# Patient Record
Sex: Female | Born: 1945 | Marital: Married | Smoking: Never smoker
Health system: Southern US, Community
[De-identification: ages and names within clinical notes are randomized; demographics above are authoritative.]

## PROBLEM LIST (undated history)

## (undated) DIAGNOSIS — E785 Hyperlipidemia, unspecified: Secondary | ICD-10-CM

---

## 2005-03-24 ENCOUNTER — Ambulatory Visit: Payer: Self-pay | Admitting: Internal Medicine

## 2006-10-20 ENCOUNTER — Ambulatory Visit: Payer: Self-pay | Admitting: Internal Medicine

## 2008-03-21 ENCOUNTER — Ambulatory Visit: Payer: Self-pay | Admitting: Family Medicine

## 2009-09-03 ENCOUNTER — Ambulatory Visit: Payer: Self-pay | Admitting: Family Medicine

## 2010-10-22 ENCOUNTER — Ambulatory Visit: Payer: Self-pay | Admitting: Family Medicine

## 2012-01-06 ENCOUNTER — Ambulatory Visit: Payer: Self-pay

## 2013-01-13 ENCOUNTER — Ambulatory Visit: Payer: Self-pay | Admitting: Nurse Practitioner

## 2013-07-18 LAB — LIPASE, BLOOD: Lipase: 132 U/L (ref 73–393)

## 2013-07-18 LAB — CBC
HCT: 38.9 % (ref 35.0–47.0)
HGB: 13.2 g/dL (ref 12.0–16.0)
MCH: 28.4 pg (ref 26.0–34.0)
MCHC: 33.9 g/dL (ref 32.0–36.0)
MCV: 84 fL (ref 80–100)
Platelet: 221 10*3/uL (ref 150–440)
RDW: 13.4 % (ref 11.5–14.5)

## 2013-07-18 LAB — COMPREHENSIVE METABOLIC PANEL
Alkaline Phosphatase: 123 U/L (ref 50–136)
Co2: 26 mmol/L (ref 21–32)
Creatinine: 0.82 mg/dL (ref 0.60–1.30)
EGFR (Non-African Amer.): >60
SGPT (ALT): 32 U/L (ref 12–78)
Sodium: 139 mmol/L (ref 136–145)
Total Protein: 6.9 g/dL (ref 6.4–8.2)

## 2013-07-19 ENCOUNTER — Observation Stay: Payer: Self-pay | Admitting: Surgery

## 2013-07-19 LAB — CBC WITH DIFFERENTIAL/PLATELET
HCT: 33.9 % — ABNORMAL LOW (ref 35.0–47.0)
HGB: 11.8 g/dL — ABNORMAL LOW (ref 12.0–16.0)
Lymphocyte #: 1.8 10*3/uL (ref 1.0–3.6)
Lymphocyte %: 29.6 %
MCH: 28.7 pg (ref 26.0–34.0)
MCHC: 34.8 g/dL (ref 32.0–36.0)
MCV: 82 fL (ref 80–100)
Monocyte %: 9.2 %
Neutrophil #: 3.7 10*3/uL (ref 1.4–6.5)
Neutrophil %: 60.7 %
Platelet: 217 10*3/uL (ref 150–440)
WBC: 6.1 10*3/uL (ref 3.6–11.0)

## 2013-07-19 LAB — COMPREHENSIVE METABOLIC PANEL
Albumin: 3.2 g/dL — ABNORMAL LOW (ref 3.4–5.0)
Alkaline Phosphatase: 88 U/L (ref 50–136)
Anion Gap: 5 — ABNORMAL LOW (ref 7–16)
Bilirubin,Total: 0.5 mg/dL (ref 0.2–1.0)
Calcium, Total: 9.5 mg/dL (ref 8.5–10.1)
Creatinine: 0.7 mg/dL (ref 0.60–1.30)
EGFR (African American): >60
EGFR (Non-African Amer.): >60
Glucose: 118 mg/dL — ABNORMAL HIGH (ref 65–99)
Osmolality: 278 (ref 275–301)
Potassium: 3.8 mmol/L (ref 3.5–5.1)
SGOT(AST): 57 U/L — ABNORMAL HIGH (ref 15–37)
SGPT (ALT): 33 U/L (ref 12–78)
Sodium: 138 mmol/L (ref 136–145)
Total Protein: 6.1 g/dL — ABNORMAL LOW (ref 6.4–8.2)

## 2013-07-19 LAB — PROTIME-INR: Prothrombin Time: 13 secs (ref 11.5–14.7)

## 2013-07-19 LAB — LIPASE, BLOOD: Lipase: 100 U/L (ref 73–393)

## 2013-07-19 LAB — AMYLASE: Amylase: 43 U/L (ref 25–115)

## 2013-07-19 LAB — APTT: Activated PTT: 30 secs (ref 23.6–35.9)

## 2013-07-23 ENCOUNTER — Emergency Department: Payer: Self-pay | Admitting: Emergency Medicine

## 2013-07-23 LAB — BASIC METABOLIC PANEL
Anion Gap: 6 — ABNORMAL LOW (ref 7–16)
BUN: 15 mg/dL (ref 7–18)
Calcium, Total: 10.1 mg/dL (ref 8.5–10.1)
Chloride: 108 mmol/L — ABNORMAL HIGH (ref 98–107)
EGFR (Non-African Amer.): >60
Glucose: 94 mg/dL (ref 65–99)
Osmolality: 282 (ref 275–301)
Potassium: 3.8 mmol/L (ref 3.5–5.1)

## 2013-07-23 LAB — CBC
HGB: 10.7 g/dL — ABNORMAL LOW (ref 12.0–16.0)
MCH: 28.7 pg (ref 26.0–34.0)
Platelet: 238 10*3/uL (ref 150–440)
RBC: 3.72 10*6/uL — ABNORMAL LOW (ref 3.80–5.20)
RDW: 13.1 % (ref 11.5–14.5)
WBC: 5.6 10*3/uL (ref 3.6–11.0)

## 2013-12-14 ENCOUNTER — Ambulatory Visit: Payer: Self-pay | Admitting: Nurse Practitioner

## 2013-12-14 LAB — CREATININE, SERUM
Creatinine: 0.66 mg/dL (ref 0.60–1.30)
EGFR (African American): >60
EGFR (Non-African Amer.): >60

## 2014-03-14 ENCOUNTER — Ambulatory Visit: Payer: Self-pay | Admitting: Obstetrics and Gynecology

## 2015-01-11 DIAGNOSIS — F419 Anxiety disorder, unspecified: Secondary | ICD-10-CM | POA: Diagnosis not present

## 2015-01-11 DIAGNOSIS — E782 Mixed hyperlipidemia: Secondary | ICD-10-CM | POA: Diagnosis not present

## 2015-01-11 DIAGNOSIS — Z79899 Other long term (current) drug therapy: Secondary | ICD-10-CM | POA: Diagnosis not present
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
## 2015-03-26 DIAGNOSIS — H3553 Other dystrophies primarily involving the sensory retina: Secondary | ICD-10-CM | POA: Diagnosis not present

## 2015-07-26 ENCOUNTER — Other Ambulatory Visit: Payer: Self-pay | Admitting: Nurse Practitioner

## 2015-07-26 DIAGNOSIS — Z1231 Encounter for screening mammogram for malignant neoplasm of breast: Secondary | ICD-10-CM

## 2015-07-26 DIAGNOSIS — R220 Localized swelling, mass and lump, head: Secondary | ICD-10-CM | POA: Diagnosis not present

## 2015-07-26 DIAGNOSIS — Z79899 Other long term (current) drug therapy: Secondary | ICD-10-CM | POA: Diagnosis not present

## 2015-07-26 DIAGNOSIS — E782 Mixed hyperlipidemia: Secondary | ICD-10-CM | POA: Diagnosis not present

## 2015-07-26 DIAGNOSIS — Z0000 Encounter for general adult medical examination without abnormal findings: Secondary | ICD-10-CM | POA: Diagnosis not present

## 2015-08-07 ENCOUNTER — Ambulatory Visit
Admission: RE | Admit: 2015-08-07 | Discharge: 2015-08-07 | Disposition: A | Discharge status: Home / Self Care | Payer: Commercial Managed Care - HMO | Source: Ambulatory Visit | Attending: Nurse Practitioner | Admitting: Nurse Practitioner

## 2015-08-07 DIAGNOSIS — Z1231 Encounter for screening mammogram for malignant neoplasm of breast: Secondary | ICD-10-CM | POA: Diagnosis not present

## 2015-12-03 DIAGNOSIS — B029 Zoster without complications: Secondary | ICD-10-CM | POA: Diagnosis not present

## 2016-01-24 DIAGNOSIS — E782 Mixed hyperlipidemia: Secondary | ICD-10-CM | POA: Diagnosis not present

## 2016-01-24 DIAGNOSIS — Z79899 Other long term (current) drug therapy: Secondary | ICD-10-CM | POA: Diagnosis not present

## 2016-07-08 IMAGING — MG MM DIGITAL SCREENING BILAT W/ CAD
1 series · 4 of 4 positions shown · non-contrast
Comparison: Previous exam(s).

CLINICAL DATA: Screening.

EXAM:
DIGITAL SCREENING BILATERAL MAMMOGRAM WITH CAD

[R CC · right · 4 of 4 slices shown]
[im 1/4]
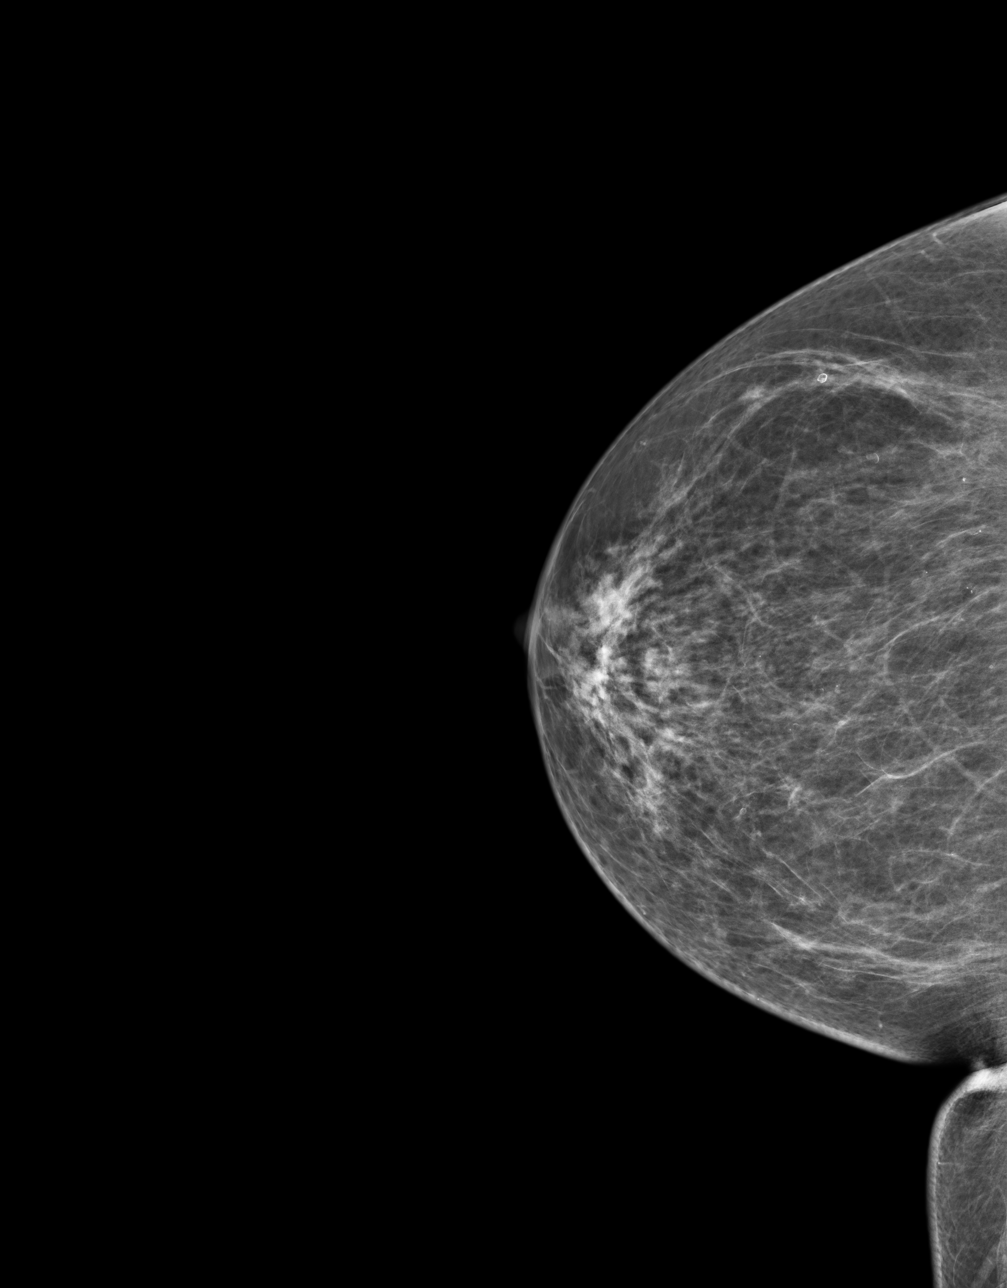
[im 2/4]
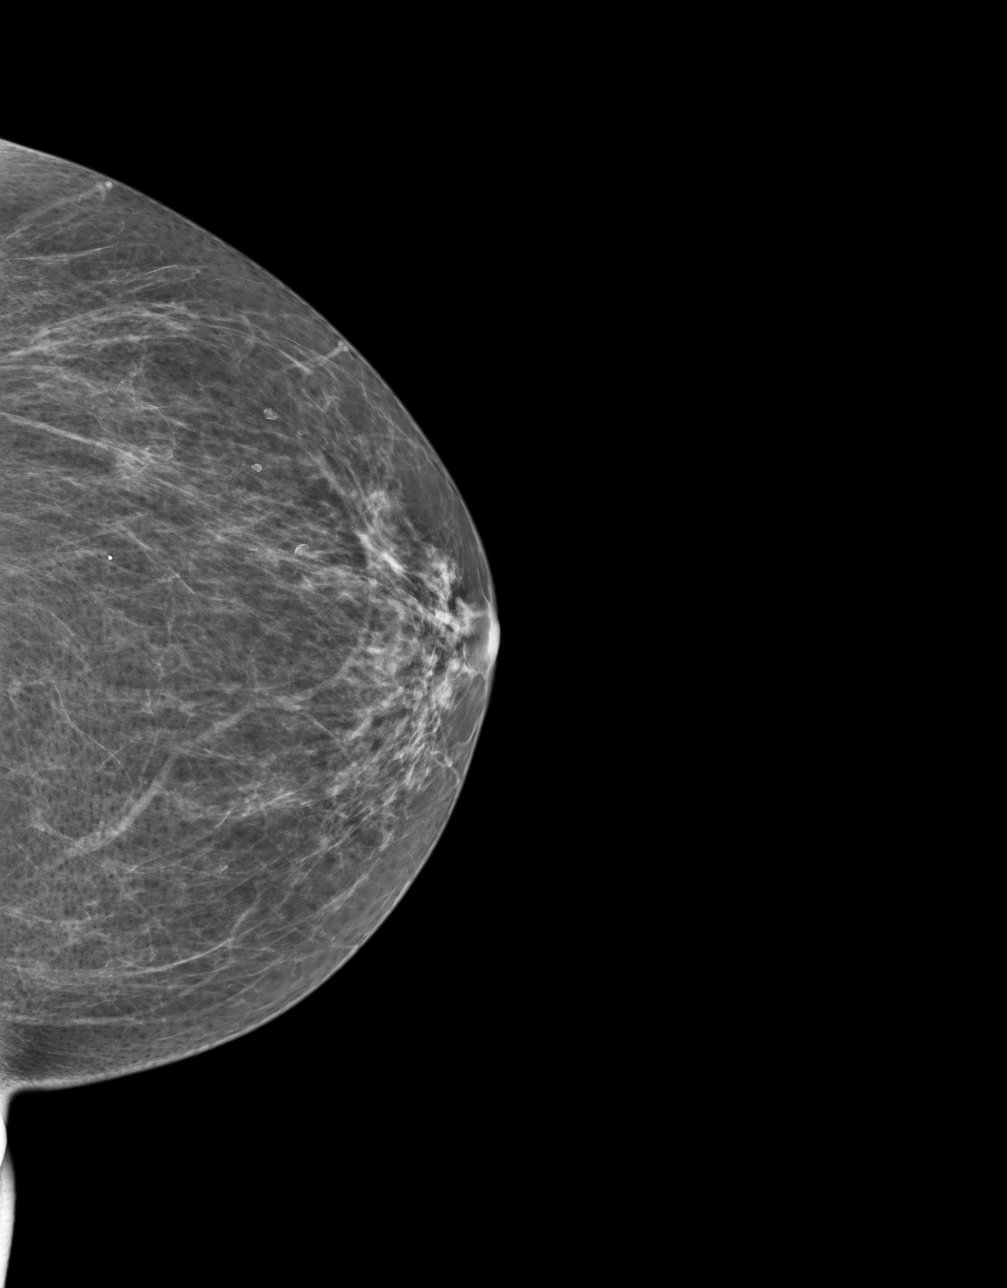
[im 3/4]
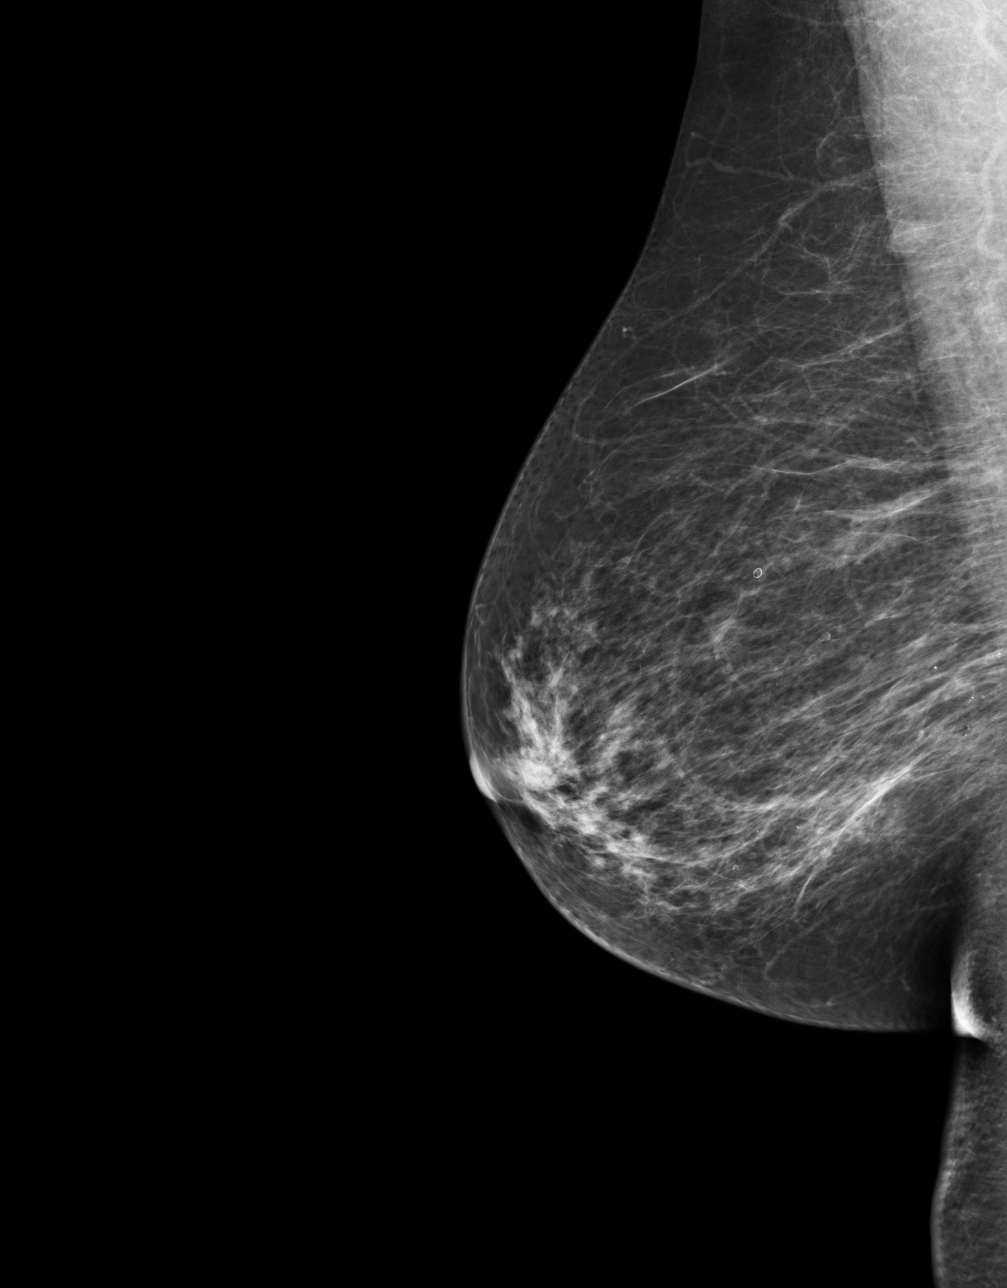
[im 4/4]
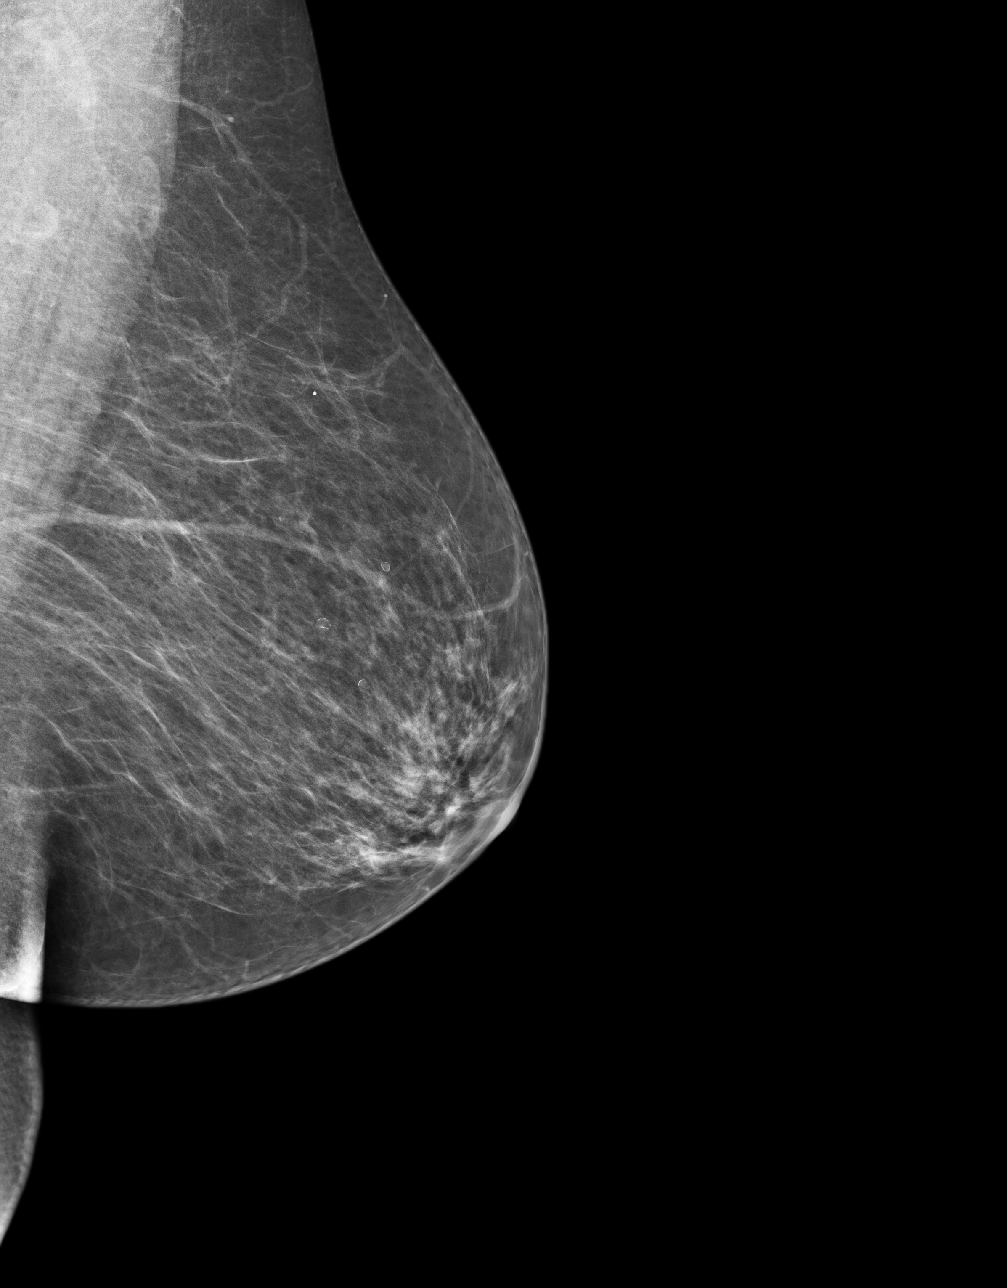

[4 of 4 positions shown; findings below may reference images not displayed]

ACR Breast Density Category b: There are scattered areas of
fibroglandular density.
FINDINGS: There are no findings suspicious for malignancy. Images were
processed with CAD.
IMPRESSION: No mammographic evidence of malignancy. A result letter of this
screening mammogram will be mailed directly to the patient.

RECOMMENDATION:
Screening mammogram in one year. (Code:AS-G-LCT)

BI-RADS CATEGORY  1: Negative.

## 2016-07-24 ENCOUNTER — Other Ambulatory Visit: Payer: Self-pay | Admitting: Nurse Practitioner

## 2016-07-24 DIAGNOSIS — Z1231 Encounter for screening mammogram for malignant neoplasm of breast: Secondary | ICD-10-CM | POA: Diagnosis not present

## 2016-07-24 DIAGNOSIS — Z79899 Other long term (current) drug therapy: Secondary | ICD-10-CM | POA: Diagnosis not present

## 2016-07-24 DIAGNOSIS — Z1159 Encounter for screening for other viral diseases: Secondary | ICD-10-CM | POA: Diagnosis not present

## 2016-07-24 DIAGNOSIS — E782 Mixed hyperlipidemia: Secondary | ICD-10-CM | POA: Diagnosis not present

## 2016-07-24 DIAGNOSIS — Z0000 Encounter for general adult medical examination without abnormal findings: Secondary | ICD-10-CM | POA: Diagnosis not present

## 2016-08-21 ENCOUNTER — Ambulatory Visit
Admission: RE | Admit: 2016-08-21 | Discharge: 2016-08-21 | Disposition: A | Discharge status: Home / Self Care | Payer: Commercial Managed Care - HMO | Source: Ambulatory Visit | Attending: Nurse Practitioner | Admitting: Nurse Practitioner

## 2016-08-21 DIAGNOSIS — R921 Mammographic calcification found on diagnostic imaging of breast: Secondary | ICD-10-CM | POA: Insufficient documentation

## 2016-08-21 DIAGNOSIS — Z1231 Encounter for screening mammogram for malignant neoplasm of breast: Secondary | ICD-10-CM | POA: Diagnosis not present

## 2016-08-25 ENCOUNTER — Other Ambulatory Visit: Payer: Self-pay | Admitting: Nurse Practitioner

## 2016-08-25 DIAGNOSIS — R921 Mammographic calcification found on diagnostic imaging of breast: Secondary | ICD-10-CM

## 2016-09-18 ENCOUNTER — Ambulatory Visit
Admission: RE | Admit: 2016-09-18 | Discharge: 2016-09-18 | Disposition: A | Discharge status: Home / Self Care | Payer: Commercial Managed Care - HMO | Source: Ambulatory Visit | Attending: Nurse Practitioner | Admitting: Nurse Practitioner

## 2016-09-18 DIAGNOSIS — R921 Mammographic calcification found on diagnostic imaging of breast: Secondary | ICD-10-CM

## 2016-09-18 NOTE — Addendum Note (Signed)
Encounter addended by: Wendi Maya, RT on: 09/18/2016 10:17 AM<BR>    Actions taken: Imaging Exam ended

## 2017-01-22 DIAGNOSIS — E782 Mixed hyperlipidemia: Secondary | ICD-10-CM | POA: Diagnosis not present

## 2017-01-22 DIAGNOSIS — Z79899 Other long term (current) drug therapy: Secondary | ICD-10-CM | POA: Diagnosis not present

## 2017-09-07 ENCOUNTER — Other Ambulatory Visit: Payer: Self-pay | Admitting: Nurse Practitioner

## 2017-09-07 DIAGNOSIS — Z0000 Encounter for general adult medical examination without abnormal findings: Secondary | ICD-10-CM | POA: Diagnosis not present

## 2017-09-07 DIAGNOSIS — Z1231 Encounter for screening mammogram for malignant neoplasm of breast: Secondary | ICD-10-CM

## 2017-09-07 DIAGNOSIS — E782 Mixed hyperlipidemia: Secondary | ICD-10-CM | POA: Diagnosis not present

## 2017-09-07 DIAGNOSIS — Z79899 Other long term (current) drug therapy: Secondary | ICD-10-CM | POA: Diagnosis not present

## 2017-09-13 DIAGNOSIS — Z1211 Encounter for screening for malignant neoplasm of colon: Secondary | ICD-10-CM | POA: Diagnosis not present

## 2017-10-01 ENCOUNTER — Ambulatory Visit
Admission: RE | Admit: 2017-10-01 | Discharge: 2017-10-01 | Disposition: A | Discharge status: Home / Self Care | Payer: Commercial Managed Care - HMO | Source: Ambulatory Visit | Attending: Nurse Practitioner | Admitting: Nurse Practitioner

## 2017-10-01 DIAGNOSIS — Z1231 Encounter for screening mammogram for malignant neoplasm of breast: Secondary | ICD-10-CM | POA: Insufficient documentation

## 2018-03-31 DIAGNOSIS — E782 Mixed hyperlipidemia: Secondary | ICD-10-CM | POA: Diagnosis not present

## 2018-03-31 DIAGNOSIS — Z79899 Other long term (current) drug therapy: Secondary | ICD-10-CM | POA: Diagnosis not present

## 2018-03-31 DIAGNOSIS — M5441 Lumbago with sciatica, right side: Secondary | ICD-10-CM | POA: Diagnosis not present

## 2018-03-31 DIAGNOSIS — M545 Low back pain: Secondary | ICD-10-CM | POA: Diagnosis not present

## 2018-04-14 DIAGNOSIS — H25013 Cortical age-related cataract, bilateral: Secondary | ICD-10-CM | POA: Diagnosis not present

## 2018-10-10 ENCOUNTER — Other Ambulatory Visit: Payer: Self-pay | Admitting: Nurse Practitioner

## 2018-10-10 DIAGNOSIS — E782 Mixed hyperlipidemia: Secondary | ICD-10-CM | POA: Diagnosis not present

## 2018-10-10 DIAGNOSIS — Z0000 Encounter for general adult medical examination without abnormal findings: Secondary | ICD-10-CM | POA: Diagnosis not present

## 2018-10-10 DIAGNOSIS — Z1231 Encounter for screening mammogram for malignant neoplasm of breast: Secondary | ICD-10-CM

## 2018-10-10 DIAGNOSIS — Z1211 Encounter for screening for malignant neoplasm of colon: Secondary | ICD-10-CM | POA: Diagnosis not present

## 2018-10-10 DIAGNOSIS — M8580 Other specified disorders of bone density and structure, unspecified site: Secondary | ICD-10-CM | POA: Diagnosis not present

## 2018-10-10 DIAGNOSIS — Z79899 Other long term (current) drug therapy: Secondary | ICD-10-CM | POA: Diagnosis not present

## 2018-10-23 DIAGNOSIS — M810 Age-related osteoporosis without current pathological fracture: Secondary | ICD-10-CM | POA: Insufficient documentation

## 2018-10-27 DIAGNOSIS — Z1211 Encounter for screening for malignant neoplasm of colon: Secondary | ICD-10-CM | POA: Diagnosis not present

## 2018-11-02 ENCOUNTER — Other Ambulatory Visit: Payer: Self-pay | Admitting: Physician Assistant

## 2018-11-02 DIAGNOSIS — H9209 Otalgia, unspecified ear: Secondary | ICD-10-CM | POA: Diagnosis not present

## 2018-11-02 DIAGNOSIS — M810 Age-related osteoporosis without current pathological fracture: Secondary | ICD-10-CM | POA: Diagnosis not present

## 2018-11-02 DIAGNOSIS — M26609 Unspecified temporomandibular joint disorder, unspecified side: Secondary | ICD-10-CM | POA: Diagnosis not present

## 2018-11-02 DIAGNOSIS — G501 Atypical facial pain: Secondary | ICD-10-CM | POA: Diagnosis not present

## 2018-11-02 DIAGNOSIS — H9202 Otalgia, left ear: Secondary | ICD-10-CM

## 2018-11-04 ENCOUNTER — Ambulatory Visit
Admission: RE | Admit: 2018-11-04 | Discharge: 2018-11-04 | Disposition: A | Discharge status: Home / Self Care | Payer: Medicare HMO | Source: Ambulatory Visit | Attending: Physician Assistant | Admitting: Physician Assistant

## 2018-11-04 DIAGNOSIS — H9202 Otalgia, left ear: Secondary | ICD-10-CM | POA: Insufficient documentation

## 2018-11-17 ENCOUNTER — Ambulatory Visit
Admission: RE | Admit: 2018-11-17 | Discharge: 2018-11-17 | Disposition: A | Discharge status: Home / Self Care | Payer: Medicare HMO | Source: Ambulatory Visit | Attending: Nurse Practitioner | Admitting: Nurse Practitioner

## 2018-11-17 DIAGNOSIS — Z1231 Encounter for screening mammogram for malignant neoplasm of breast: Secondary | ICD-10-CM

## 2019-04-11 DIAGNOSIS — E782 Mixed hyperlipidemia: Secondary | ICD-10-CM | POA: Diagnosis not present

## 2019-04-11 DIAGNOSIS — D352 Benign neoplasm of pituitary gland: Secondary | ICD-10-CM | POA: Diagnosis not present

## 2019-04-11 DIAGNOSIS — M810 Age-related osteoporosis without current pathological fracture: Secondary | ICD-10-CM | POA: Diagnosis not present

## 2019-04-11 DIAGNOSIS — Z79899 Other long term (current) drug therapy: Secondary | ICD-10-CM | POA: Diagnosis not present

## 2019-10-17 ENCOUNTER — Other Ambulatory Visit: Payer: Self-pay | Admitting: Nurse Practitioner

## 2019-10-17 DIAGNOSIS — Z1231 Encounter for screening mammogram for malignant neoplasm of breast: Secondary | ICD-10-CM

## 2019-10-18 DIAGNOSIS — Z0000 Encounter for general adult medical examination without abnormal findings: Secondary | ICD-10-CM | POA: Diagnosis not present

## 2019-10-18 DIAGNOSIS — E559 Vitamin D deficiency, unspecified: Secondary | ICD-10-CM | POA: Diagnosis not present

## 2019-10-18 DIAGNOSIS — Z1211 Encounter for screening for malignant neoplasm of colon: Secondary | ICD-10-CM | POA: Diagnosis not present

## 2019-10-18 DIAGNOSIS — Z79899 Other long term (current) drug therapy: Secondary | ICD-10-CM | POA: Diagnosis not present

## 2019-10-18 DIAGNOSIS — M25511 Pain in right shoulder: Secondary | ICD-10-CM | POA: Diagnosis not present

## 2019-10-18 DIAGNOSIS — M810 Age-related osteoporosis without current pathological fracture: Secondary | ICD-10-CM | POA: Diagnosis not present

## 2019-10-18 DIAGNOSIS — M7521 Bicipital tendinitis, right shoulder: Secondary | ICD-10-CM | POA: Diagnosis not present

## 2019-10-18 DIAGNOSIS — E782 Mixed hyperlipidemia: Secondary | ICD-10-CM | POA: Diagnosis not present

## 2019-10-18 DIAGNOSIS — G8929 Other chronic pain: Secondary | ICD-10-CM | POA: Diagnosis not present

## 2019-10-19 IMAGING — MG DIGITAL SCREENING BILATERAL MAMMOGRAM WITH TOMO AND CAD
8 series · 8 of 24 positions shown · non-contrast
Comparison: Previous exam(s).

CLINICAL DATA: Screening.

EXAM:
DIGITAL SCREENING BILATERAL MAMMOGRAM WITH TOMO AND CAD

[L MLO synth-2D]
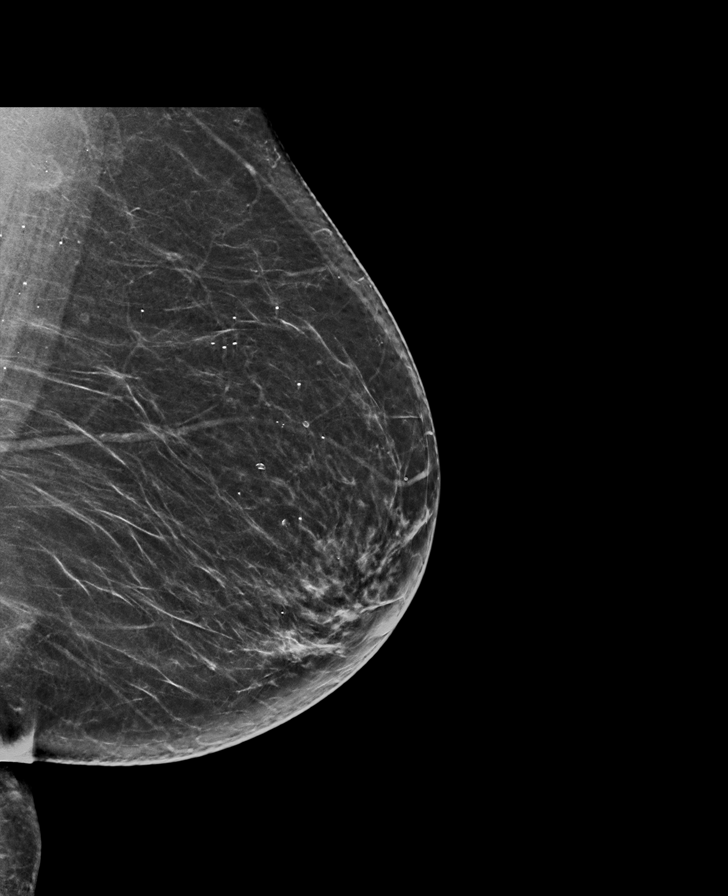

[R CC synth-2D]
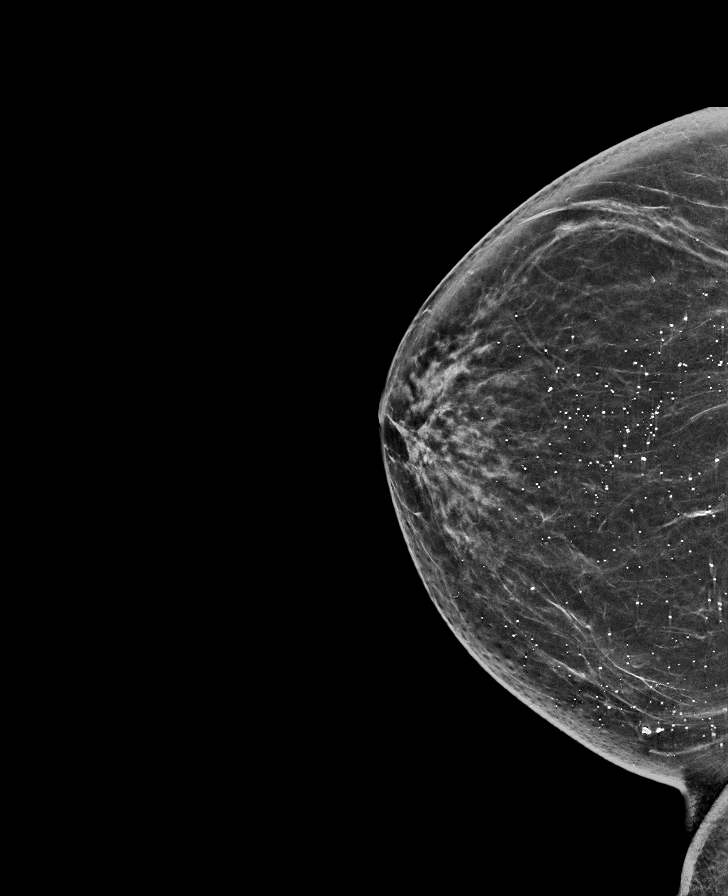

[L CC synth-2D]
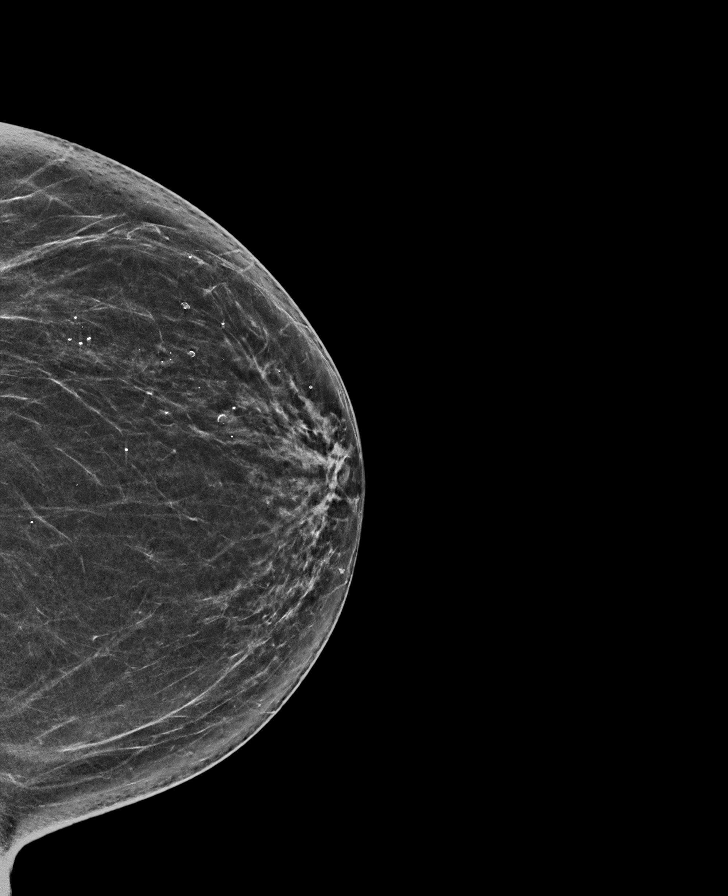

[R MLO synth-2D]
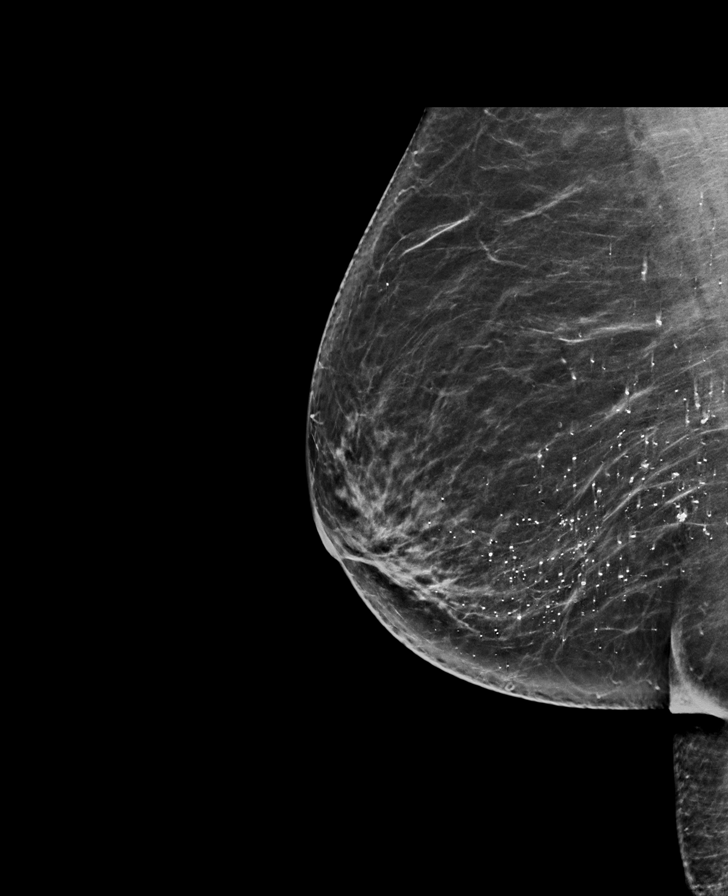

[R MLO tomo · tomo slice 39/76.0]
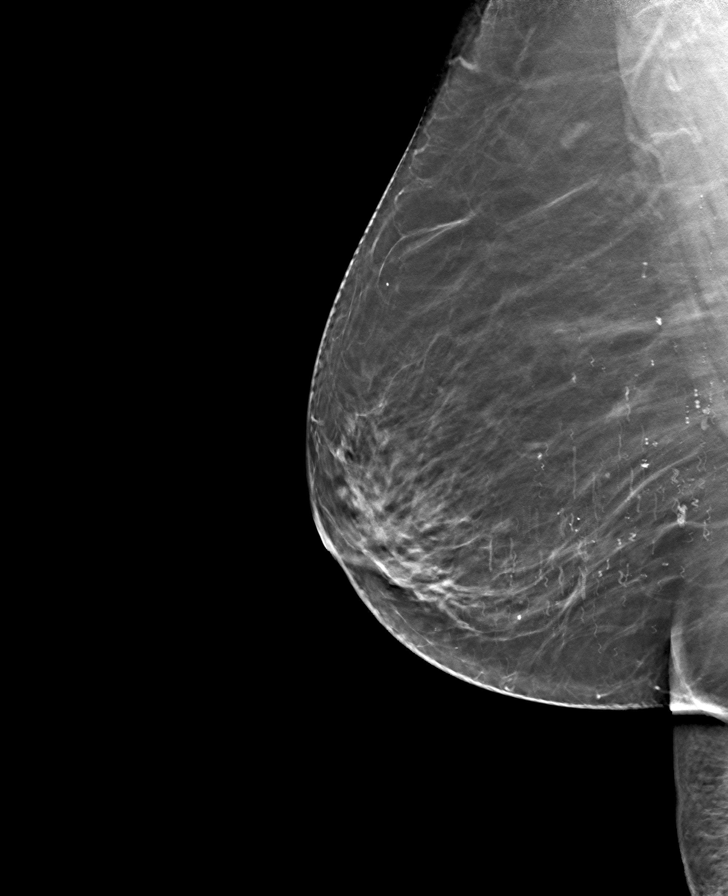

[L CC tomo · tomo slice 33/66.0]
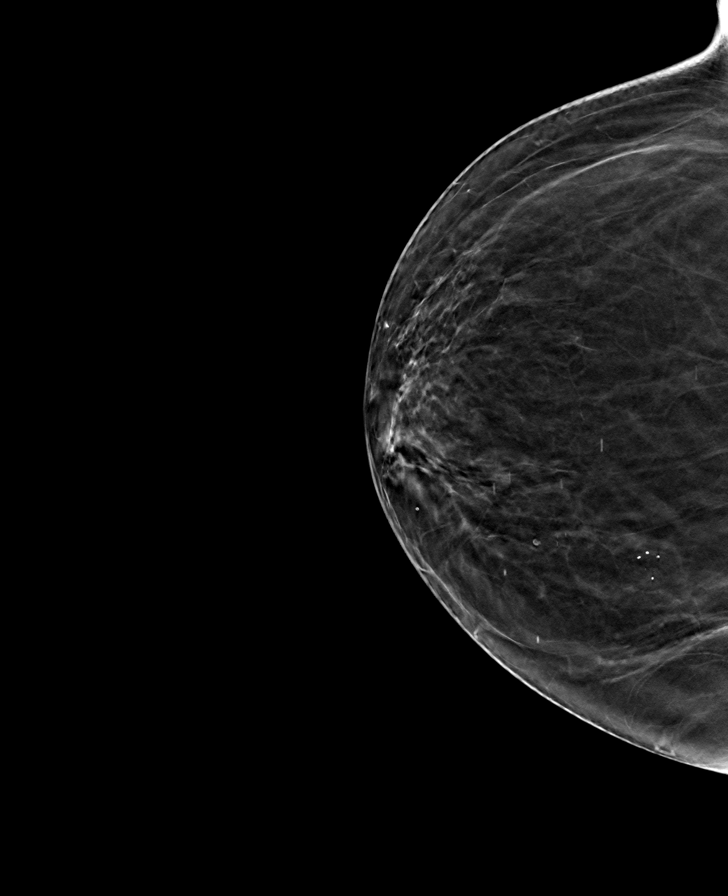

[R CC tomo · tomo slice 35/68.0]
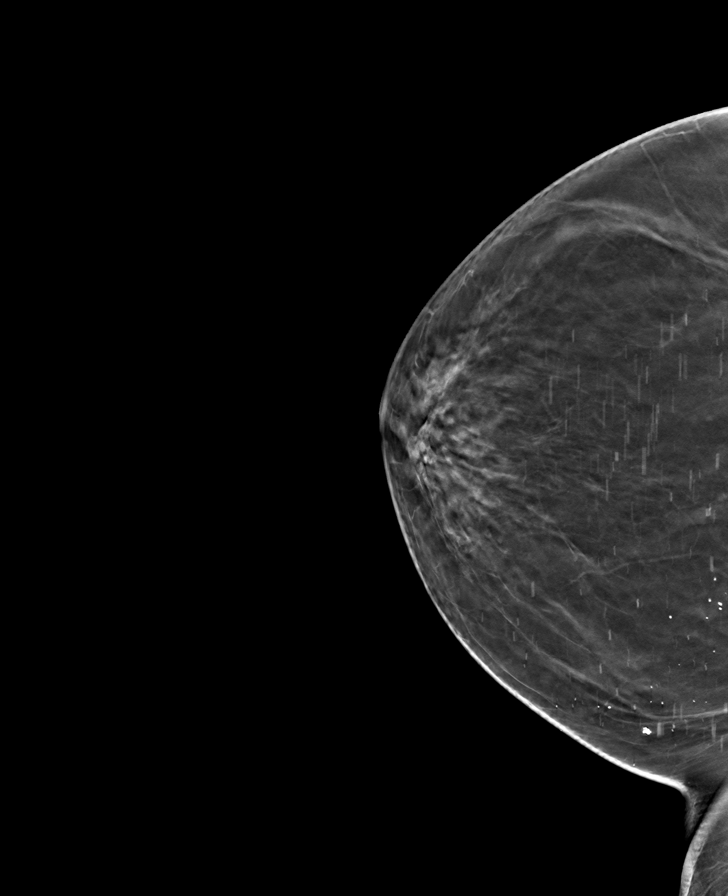

[L MLO tomo · tomo slice 38/75.0]
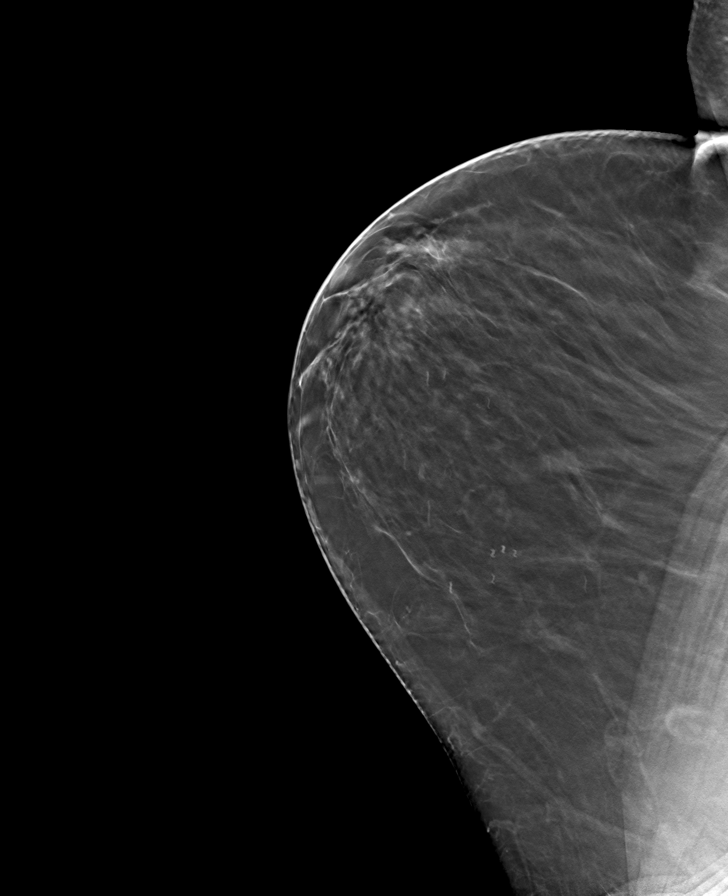

[8 of 24 positions shown; findings below may reference images not displayed]

ACR Breast Density Category b: There are scattered areas of
fibroglandular density.
FINDINGS: There are no findings suspicious for malignancy. Images were
processed with CAD.
IMPRESSION: No mammographic evidence of malignancy. A result letter of this
screening mammogram will be mailed directly to the patient.

RECOMMENDATION:
Screening mammogram in one year. (Code:CN-U-775)

BI-RADS CATEGORY  1: Negative.

## 2019-10-27 DIAGNOSIS — Z1211 Encounter for screening for malignant neoplasm of colon: Secondary | ICD-10-CM | POA: Diagnosis not present

## 2019-11-22 ENCOUNTER — Ambulatory Visit
Admission: RE | Admit: 2019-11-22 | Discharge: 2019-11-22 | Disposition: A | Discharge status: Home / Self Care | Payer: Medicare HMO | Source: Ambulatory Visit | Attending: Nurse Practitioner | Admitting: Nurse Practitioner

## 2019-11-22 ENCOUNTER — Other Ambulatory Visit: Payer: Self-pay

## 2019-11-22 DIAGNOSIS — Z1231 Encounter for screening mammogram for malignant neoplasm of breast: Secondary | ICD-10-CM

## 2020-02-28 ENCOUNTER — Other Ambulatory Visit: Payer: Self-pay | Admitting: Nurse Practitioner

## 2020-02-28 DIAGNOSIS — S76311A Strain of muscle, fascia and tendon of the posterior muscle group at thigh level, right thigh, initial encounter: Secondary | ICD-10-CM | POA: Diagnosis not present

## 2020-02-28 DIAGNOSIS — I83811 Varicose veins of right lower extremities with pain: Secondary | ICD-10-CM

## 2020-02-28 DIAGNOSIS — M79604 Pain in right leg: Secondary | ICD-10-CM

## 2020-03-01 ENCOUNTER — Ambulatory Visit
Admission: RE | Admit: 2020-03-01 | Discharge: 2020-03-01 | Disposition: A | Discharge status: Home / Self Care | Payer: Medicare HMO | Source: Ambulatory Visit | Attending: Nurse Practitioner | Admitting: Nurse Practitioner

## 2020-03-01 ENCOUNTER — Other Ambulatory Visit: Payer: Self-pay

## 2020-03-01 ENCOUNTER — Encounter (INDEPENDENT_AMBULATORY_CARE_PROVIDER_SITE_OTHER): Payer: Self-pay

## 2020-03-01 DIAGNOSIS — M79661 Pain in right lower leg: Secondary | ICD-10-CM | POA: Diagnosis not present

## 2020-03-01 DIAGNOSIS — S76311A Strain of muscle, fascia and tendon of the posterior muscle group at thigh level, right thigh, initial encounter: Secondary | ICD-10-CM | POA: Diagnosis not present

## 2020-03-01 DIAGNOSIS — M79604 Pain in right leg: Secondary | ICD-10-CM | POA: Insufficient documentation

## 2020-03-01 DIAGNOSIS — I83811 Varicose veins of right lower extremities with pain: Secondary | ICD-10-CM | POA: Diagnosis not present

## 2020-04-16 DIAGNOSIS — Z79899 Other long term (current) drug therapy: Secondary | ICD-10-CM | POA: Diagnosis not present

## 2020-04-16 DIAGNOSIS — M810 Age-related osteoporosis without current pathological fracture: Secondary | ICD-10-CM | POA: Diagnosis not present

## 2020-04-16 DIAGNOSIS — M5441 Lumbago with sciatica, right side: Secondary | ICD-10-CM | POA: Diagnosis not present

## 2020-04-16 DIAGNOSIS — G8929 Other chronic pain: Secondary | ICD-10-CM | POA: Diagnosis not present

## 2020-04-16 DIAGNOSIS — E782 Mixed hyperlipidemia: Secondary | ICD-10-CM | POA: Diagnosis not present

## 2020-11-12 ENCOUNTER — Other Ambulatory Visit: Payer: Self-pay | Admitting: Nurse Practitioner

## 2020-11-12 DIAGNOSIS — Z1231 Encounter for screening mammogram for malignant neoplasm of breast: Secondary | ICD-10-CM

## 2020-11-12 DIAGNOSIS — M5441 Lumbago with sciatica, right side: Secondary | ICD-10-CM | POA: Diagnosis not present

## 2020-11-12 DIAGNOSIS — G8929 Other chronic pain: Secondary | ICD-10-CM | POA: Diagnosis not present

## 2020-11-12 DIAGNOSIS — M810 Age-related osteoporosis without current pathological fracture: Secondary | ICD-10-CM | POA: Diagnosis not present

## 2020-11-12 DIAGNOSIS — Z0000 Encounter for general adult medical examination without abnormal findings: Secondary | ICD-10-CM | POA: Diagnosis not present

## 2020-11-12 DIAGNOSIS — Z1211 Encounter for screening for malignant neoplasm of colon: Secondary | ICD-10-CM | POA: Diagnosis not present

## 2020-11-12 DIAGNOSIS — E782 Mixed hyperlipidemia: Secondary | ICD-10-CM | POA: Diagnosis not present

## 2020-11-12 DIAGNOSIS — Z79899 Other long term (current) drug therapy: Secondary | ICD-10-CM | POA: Diagnosis not present

## 2020-11-25 ENCOUNTER — Ambulatory Visit: Payer: Medicare HMO

## 2020-11-27 ENCOUNTER — Other Ambulatory Visit: Payer: Self-pay

## 2020-11-27 ENCOUNTER — Ambulatory Visit
Admission: RE | Admit: 2020-11-27 | Discharge: 2020-11-27 | Disposition: A | Discharge status: Home / Self Care | Payer: Medicare HMO | Source: Ambulatory Visit | Attending: Nurse Practitioner | Admitting: Nurse Practitioner

## 2020-11-27 DIAGNOSIS — Z1231 Encounter for screening mammogram for malignant neoplasm of breast: Secondary | ICD-10-CM | POA: Insufficient documentation

## 2020-11-29 DIAGNOSIS — Z1211 Encounter for screening for malignant neoplasm of colon: Secondary | ICD-10-CM | POA: Diagnosis not present

## 2021-05-19 DIAGNOSIS — G8929 Other chronic pain: Secondary | ICD-10-CM | POA: Diagnosis not present

## 2021-05-19 DIAGNOSIS — E785 Hyperlipidemia, unspecified: Secondary | ICD-10-CM | POA: Diagnosis not present

## 2021-05-19 DIAGNOSIS — M810 Age-related osteoporosis without current pathological fracture: Secondary | ICD-10-CM | POA: Diagnosis not present

## 2021-05-19 DIAGNOSIS — Z79899 Other long term (current) drug therapy: Secondary | ICD-10-CM | POA: Diagnosis not present

## 2021-05-19 DIAGNOSIS — M5441 Lumbago with sciatica, right side: Secondary | ICD-10-CM | POA: Diagnosis not present

## 2021-06-04 ENCOUNTER — Other Ambulatory Visit (HOSPITAL_COMMUNITY): Payer: Self-pay | Admitting: Nurse Practitioner

## 2021-06-04 ENCOUNTER — Other Ambulatory Visit: Payer: Self-pay | Admitting: Nurse Practitioner

## 2021-06-04 DIAGNOSIS — G8929 Other chronic pain: Secondary | ICD-10-CM

## 2021-06-11 ENCOUNTER — Ambulatory Visit
Admission: RE | Admit: 2021-06-11 | Discharge: 2021-06-11 | Disposition: A | Discharge status: Home / Self Care | Payer: Medicare HMO | Source: Ambulatory Visit | Attending: Nurse Practitioner | Admitting: Nurse Practitioner

## 2021-06-11 ENCOUNTER — Other Ambulatory Visit: Payer: Self-pay

## 2021-06-11 DIAGNOSIS — M5441 Lumbago with sciatica, right side: Secondary | ICD-10-CM | POA: Diagnosis not present

## 2021-06-11 DIAGNOSIS — G8929 Other chronic pain: Secondary | ICD-10-CM | POA: Insufficient documentation

## 2021-06-11 DIAGNOSIS — M545 Low back pain, unspecified: Secondary | ICD-10-CM | POA: Diagnosis not present

## 2021-08-25 DIAGNOSIS — H25813 Combined forms of age-related cataract, bilateral: Secondary | ICD-10-CM | POA: Diagnosis not present

## 2021-10-22 ENCOUNTER — Other Ambulatory Visit: Payer: Self-pay | Admitting: Nurse Practitioner

## 2021-10-22 DIAGNOSIS — Z1231 Encounter for screening mammogram for malignant neoplasm of breast: Secondary | ICD-10-CM | POA: Diagnosis not present

## 2021-10-22 DIAGNOSIS — Z1211 Encounter for screening for malignant neoplasm of colon: Secondary | ICD-10-CM | POA: Diagnosis not present

## 2021-10-22 DIAGNOSIS — Z79899 Other long term (current) drug therapy: Secondary | ICD-10-CM | POA: Diagnosis not present

## 2021-10-22 DIAGNOSIS — E782 Mixed hyperlipidemia: Secondary | ICD-10-CM | POA: Diagnosis not present

## 2021-10-22 DIAGNOSIS — M5441 Lumbago with sciatica, right side: Secondary | ICD-10-CM | POA: Diagnosis not present

## 2021-10-22 DIAGNOSIS — M810 Age-related osteoporosis without current pathological fracture: Secondary | ICD-10-CM | POA: Diagnosis not present

## 2021-10-22 DIAGNOSIS — Z0000 Encounter for general adult medical examination without abnormal findings: Secondary | ICD-10-CM | POA: Diagnosis not present

## 2021-10-22 DIAGNOSIS — G8929 Other chronic pain: Secondary | ICD-10-CM | POA: Diagnosis not present

## 2021-11-05 DIAGNOSIS — Z1211 Encounter for screening for malignant neoplasm of colon: Secondary | ICD-10-CM | POA: Diagnosis not present

## 2021-12-01 ENCOUNTER — Ambulatory Visit
Admission: RE | Admit: 2021-12-01 | Discharge: 2021-12-01 | Disposition: A | Discharge status: Home / Self Care | Payer: Medicare HMO | Source: Ambulatory Visit | Attending: Nurse Practitioner | Admitting: Nurse Practitioner

## 2021-12-01 ENCOUNTER — Other Ambulatory Visit: Payer: Self-pay

## 2021-12-01 DIAGNOSIS — Z1231 Encounter for screening mammogram for malignant neoplasm of breast: Secondary | ICD-10-CM | POA: Diagnosis not present

## 2022-05-04 DIAGNOSIS — E782 Mixed hyperlipidemia: Secondary | ICD-10-CM | POA: Diagnosis not present

## 2022-05-04 DIAGNOSIS — M810 Age-related osteoporosis without current pathological fracture: Secondary | ICD-10-CM | POA: Diagnosis not present

## 2022-05-04 DIAGNOSIS — Z79899 Other long term (current) drug therapy: Secondary | ICD-10-CM | POA: Diagnosis not present

## 2022-05-04 DIAGNOSIS — G8929 Other chronic pain: Secondary | ICD-10-CM | POA: Diagnosis not present

## 2022-05-04 DIAGNOSIS — M5441 Lumbago with sciatica, right side: Secondary | ICD-10-CM | POA: Diagnosis not present

## 2022-09-02 DIAGNOSIS — R209 Unspecified disturbances of skin sensation: Secondary | ICD-10-CM | POA: Diagnosis not present

## 2022-09-02 DIAGNOSIS — M5431 Sciatica, right side: Secondary | ICD-10-CM | POA: Diagnosis not present

## 2022-09-02 DIAGNOSIS — R399 Unspecified symptoms and signs involving the genitourinary system: Secondary | ICD-10-CM | POA: Diagnosis not present

## 2022-10-20 ENCOUNTER — Other Ambulatory Visit: Payer: Self-pay | Admitting: Nurse Practitioner

## 2022-10-20 DIAGNOSIS — Z79899 Other long term (current) drug therapy: Secondary | ICD-10-CM | POA: Diagnosis not present

## 2022-10-20 DIAGNOSIS — M810 Age-related osteoporosis without current pathological fracture: Secondary | ICD-10-CM | POA: Diagnosis not present

## 2022-10-20 DIAGNOSIS — E559 Vitamin D deficiency, unspecified: Secondary | ICD-10-CM | POA: Diagnosis not present

## 2022-10-20 DIAGNOSIS — Z1211 Encounter for screening for malignant neoplasm of colon: Secondary | ICD-10-CM | POA: Diagnosis not present

## 2022-10-20 DIAGNOSIS — Z1231 Encounter for screening mammogram for malignant neoplasm of breast: Secondary | ICD-10-CM | POA: Diagnosis not present

## 2022-10-20 DIAGNOSIS — E782 Mixed hyperlipidemia: Secondary | ICD-10-CM | POA: Diagnosis not present

## 2022-10-20 DIAGNOSIS — Z0000 Encounter for general adult medical examination without abnormal findings: Secondary | ICD-10-CM | POA: Diagnosis not present

## 2022-10-20 DIAGNOSIS — Z1331 Encounter for screening for depression: Secondary | ICD-10-CM | POA: Diagnosis not present

## 2022-11-05 ENCOUNTER — Ambulatory Visit
Admission: EM | Admit: 2022-11-05 | Discharge: 2022-11-05 | Disposition: A | Discharge status: Home / Self Care | Payer: Medicare HMO | Attending: Physician Assistant | Admitting: Physician Assistant

## 2022-11-05 DIAGNOSIS — J101 Influenza due to other identified influenza virus with other respiratory manifestations: Secondary | ICD-10-CM | POA: Diagnosis not present

## 2022-11-05 DIAGNOSIS — M810 Age-related osteoporosis without current pathological fracture: Secondary | ICD-10-CM | POA: Insufficient documentation

## 2022-11-05 DIAGNOSIS — J029 Acute pharyngitis, unspecified: Secondary | ICD-10-CM | POA: Diagnosis not present

## 2022-11-05 DIAGNOSIS — Z1211 Encounter for screening for malignant neoplasm of colon: Secondary | ICD-10-CM | POA: Diagnosis not present

## 2022-11-05 DIAGNOSIS — Z1152 Encounter for screening for COVID-19: Secondary | ICD-10-CM | POA: Insufficient documentation

## 2022-11-05 DIAGNOSIS — Z79899 Other long term (current) drug therapy: Secondary | ICD-10-CM | POA: Insufficient documentation

## 2022-11-05 DIAGNOSIS — R051 Acute cough: Secondary | ICD-10-CM | POA: Diagnosis not present

## 2022-11-05 DIAGNOSIS — R112 Nausea with vomiting, unspecified: Secondary | ICD-10-CM | POA: Diagnosis not present

## 2022-11-05 DIAGNOSIS — E785 Hyperlipidemia, unspecified: Secondary | ICD-10-CM | POA: Diagnosis not present

## 2022-11-05 HISTORY — DX: Hyperlipidemia, unspecified: E78.5

## 2022-11-05 LAB — RESP PANEL BY RT-PCR (RSV, FLU A&B, COVID)  RVPGX2
Influenza A by PCR: NEGATIVE
Influenza B by PCR: POSITIVE — AB
Resp Syncytial Virus by PCR: NEGATIVE
SARS Coronavirus 2 by RT PCR: NEGATIVE

## 2022-11-05 LAB — GROUP A STREP BY PCR: Group A Strep by PCR: NOT DETECTED

## 2022-11-05 MED ORDER — BENZONATATE 200 MG PO CAPS: 200.0000 mg | ORAL_CAPSULE | Freq: Three times a day (TID) | ORAL | 0 refills | Status: AC | PRN

## 2022-11-05 MED ORDER — ONDANSETRON 4 MG PO TBDP
4.0000 mg | ORAL_TABLET | Freq: Once | ORAL | Status: AC
  Administered 2022-11-05: 4 mg via ORAL

## 2022-11-05 MED ORDER — ONDANSETRON 4 MG PO TBDP: 4.0000 mg | ORAL_TABLET | Freq: Four times a day (QID) | ORAL | 0 refills | Status: AC | PRN

## 2022-11-05 MED ORDER — OSELTAMIVIR PHOSPHATE 75 MG PO CAPS: 75.0000 mg | ORAL_CAPSULE | Freq: Two times a day (BID) | ORAL | 0 refills | Status: AC

## 2022-11-05 NOTE — ED Triage Notes (Signed)
Pt c/o cough,congestion,sore throat, otalgia & emesis x5 today, can't keep any fluids down vomits everytime. Sx's started 3 days ago, has been around sick granddaughter. No otc treatment.

## 2022-11-05 NOTE — Discharge Instructions (Addendum)
-  You are positive for the flu.  I sent Tamiflu to pharmacy. - I also sent nausea medication.  Increase rest and fluids. - Plain Mucinex for cough as needed in addition to the benzonatate I sent. -Should be feeling better over the next few days. - If any uncontrolled fever, weakness, signs of dehydration, chest pain or breathing difficulty or abdominal pain, go to emergency department.

## 2022-11-05 NOTE — ED Provider Notes (Signed)
[facility]    CSN: [id number] Arrival date & time: 11/05/22  1700      History   Chief Complaint Chief Complaint  Patient presents with   Cough   Emesis   Congestion    HPI Felicia Mckenzie is a 76 y.o. female presenting for fatigue, cough, congestion, sore throat, body aches, ear pain, and nausea/vomiting x 3 days.  No associated fever.  No chest pain or breathing difficulty.  Denies abdominal pain or diarrhea.  No urinary symptoms.  She has been around her granddaughter who has been sick with fatigue and a cough.  Granddaughter is reportedly feeling better now.  Patient has had difficulty eating or drinking due to significant nausea.  She is otherwise very healthy.  No history of heart or lung disease.  Medical history significant for hyperlipidemia and osteoporosis.  HPI  Past Medical History:  Diagnosis Date   Hyperlipemia     There are no problems to display for this patient.   History reviewed. No pertinent surgical history.  OB History   No obstetric history on file.      Home Medications    Prior to Admission medications   Medication Sig Start Date End Date Taking? Authorizing Provider  aspirin EC 81 MG tablet Take by mouth.   Yes [provider]  benzonatate (TESSALON) 200 MG capsule Take 1 capsule (200 mg total) by mouth 3 (three) times daily as needed for cough. 11/05/22  Yes Danton Clap, PA-C  Calcium Carb-Cholecalciferol 600-20 MG-MCG TABS Take 2 tablets by mouth daily.   Yes [provider]  diclofenac (VOLTAREN) 75 MG EC tablet Take 1 tablet by mouth 2 (two) times daily with a meal. 03/24/22  Yes [provider]  LORazepam (ATIVAN) 1 MG tablet Take by mouth. 06/03/22  Yes [provider]  ondansetron (ZOFRAN-ODT) 4 MG disintegrating tablet Take 1 tablet (4 mg total) by mouth every 6 (six) hours as needed for nausea or vomiting. 11/05/22  Yes Danton Clap, PA-C  oseltamivir (TAMIFLU) 75 MG capsule Take  1 capsule (75 mg total) by mouth every 12 (twelve) hours for 5 days. 11/05/22 11/10/22 Yes Danton Clap, PA-C  oxyCODONE-acetaminophen (PERCOCET/ROXICET) 5-325 MG tablet Take by mouth. 10/22/21  Yes [provider]  simvastatin (ZOCOR) 40 MG tablet Take 1 tablet by mouth daily. 07/29/22  Yes [provider]    Family History Family History  Problem Relation Age of Onset   Breast cancer Neg Hx     Social History Social History   Tobacco Use   Smoking status: Never   Smokeless tobacco: Never  Vaping Use   Vaping Use: Never used  Substance Use Topics   Alcohol use: Never   Drug use: Never     Allergies   Diphenhydramine hcl, Hydrocodone-acetaminophen, and Tramadol   Review of Systems Review of Systems  Constitutional:  Positive for fatigue. Negative for chills, diaphoresis and fever.  HENT:  Positive for congestion, ear pain, rhinorrhea and sore throat. Negative for sinus pressure and sinus pain.   Respiratory:  Positive for cough. Negative for shortness of breath and wheezing.   Cardiovascular:  Negative for chest pain.  Gastrointestinal:  Positive for nausea and vomiting. Negative for abdominal pain and diarrhea.  Musculoskeletal:  Positive for myalgias.  Skin:  Negative for rash.  Neurological:  Negative for weakness and headaches.  Hematological:  Negative for adenopathy.     Physical Exam Triage Vital Signs ED Triage Vitals  Enc Vitals Group     BP      Pulse      Resp      Temp      Temp src      SpO2      Weight      Height      Head Circumference      Peak Flow      Pain Score      Pain Loc      Pain Edu?      Excl. in [place]?    No data found.  Updated Vital Signs BP 102/62 (BP Location: Left Arm)   Pulse 70   Temp 98.9 F (37.2 C) (Oral)   Resp 16   Ht '4\' 3"'$  (1.295 m)   Wt 121 lb (54.9 kg)   SpO2 93%   BMI 32.71 kg/m     Physical Exam Vitals and nursing note reviewed.  Constitutional:      General: She is not in  acute distress.    Appearance: Normal appearance. She is ill-appearing. She is not toxic-appearing.  HENT:     Head: Normocephalic and atraumatic.     Right Ear: Tympanic membrane, ear canal and external ear normal.     Left Ear: Tympanic membrane, ear canal and external ear normal.     Nose: Congestion present.     Mouth/Throat:     Mouth: Mucous membranes are moist.     Pharynx: Oropharynx is clear. Posterior oropharyngeal erythema present.  Eyes:     General: No scleral icterus.       Right eye: No discharge.        Left eye: No discharge.     Conjunctiva/sclera: Conjunctivae normal.  Cardiovascular:     Rate and Rhythm: Normal rate and regular rhythm.     Heart sounds: Normal heart sounds.  Pulmonary:     Effort: Pulmonary effort is normal. No respiratory distress.     Breath sounds: Normal breath sounds. No wheezing, rhonchi or rales.  Abdominal:     Palpations: Abdomen is soft.     Tenderness: There is no abdominal tenderness. There is no right CVA tenderness or left CVA tenderness.  Musculoskeletal:     Cervical back: Neck supple.  Skin:    General: Skin is dry.  Neurological:     General: No focal deficit present.     Mental Status: She is alert. Mental status is at baseline.     Motor: No weakness.     Gait: Gait normal.  Psychiatric:        Mood and Affect: Mood normal.        Behavior: Behavior normal.        Thought Content: Thought content normal.      UC Treatments / Results  Labs (all labs ordered are listed, but only abnormal results are displayed) Labs Reviewed  RESP PANEL BY RT-PCR (RSV, FLU A&B, COVID)  RVPGX2 - Abnormal; Notable for the following components:      Result Value   Influenza B by PCR POSITIVE (*)    All other components within normal limits  GROUP A STREP BY PCR    EKG   Radiology No results found.  Procedures Procedures (including critical care time)  Medications Ordered in UC Medications  ondansetron (ZOFRAN-ODT)  disintegrating tablet 4 mg (4 mg Oral Given 11/05/22 1827)    Initial Impression / Assessment and Plan / UC Course  I have reviewed the triage vital  signs and the nursing notes.  Pertinent labs & imaging results that were available during my care of the patient were reviewed by me and considered in my medical decision making (see chart for details).   76 year old female presents with daughter for fatigue, cough, congestion, bilateral ear pain/pressure, sore throat, nausea and vomiting that began 3 days ago.  Exposed to granddaughter who was sick with fatigue and cough recently.  Vitals are all normal and stable.  Patient is mildly ill-appearing but nontoxic.  On exam she has mild erythema posterior pharynx and nasal congestion with clear postnasal drainage.  Chest is clear auscultation heart regular rate and rhythm.  Abdomen is soft and nontender.  Patient given 4 mg ODT Zofran in clinic for relief of nausea.  Ordered respiratory panel and PCR strep testing given complaints.  Positive influenza B.  Discussed results with patient and her daughter.  Patient says symptoms started about 3 days ago.  Will try her on Tamiflu since she is elderly and feels very ill.  Also sent Zofran and benzonatate.  Encouraged plenty of rest and fluids.  Reviewed emergency department if any signs of dehydration or weakness or uncontrolled fever.  F/u here as needed.   Final Clinical Impressions(s) / UC Diagnoses   Final diagnoses:  Influenza B  Acute cough  Nausea and vomiting, unspecified vomiting type  Sore throat     Discharge Instructions      -You are positive for the flu.  I sent Tamiflu to pharmacy. - I also sent nausea medication.  Increase rest and fluids. - Plain Mucinex for cough as needed in addition to the benzonatate I sent. -Should be feeling better over the next few days. - If any uncontrolled fever, weakness, signs of dehydration, chest pain or breathing difficulty or abdominal pain, go  to emergency department.     ED Prescriptions     Medication Sig Dispense Auth. Provider   oseltamivir (TAMIFLU) 75 MG capsule Take 1 capsule (75 mg total) by mouth every 12 (twelve) hours for 5 days. 10 capsule Laurene Footman B, PA-C   ondansetron (ZOFRAN-ODT) 4 MG disintegrating tablet Take 1 tablet (4 mg total) by mouth every 6 (six) hours as needed for nausea or vomiting. 15 tablet Laurene Footman B, PA-C   benzonatate (TESSALON) 200 MG capsule Take 1 capsule (200 mg total) by mouth 3 (three) times daily as needed for cough. 20 capsule Danton Clap, PA-C      PDMP not reviewed this encounter.   Danton Clap, PA-C 11/05/22 1904

## 2022-12-02 ENCOUNTER — Ambulatory Visit
Admission: RE | Admit: 2022-12-02 | Discharge: 2022-12-02 | Disposition: A | Discharge status: Home / Self Care | Payer: Medicare HMO | Source: Ambulatory Visit | Attending: Nurse Practitioner | Admitting: Nurse Practitioner

## 2022-12-02 DIAGNOSIS — Z1231 Encounter for screening mammogram for malignant neoplasm of breast: Secondary | ICD-10-CM | POA: Diagnosis not present

## 2023-02-23 DIAGNOSIS — E782 Mixed hyperlipidemia: Secondary | ICD-10-CM | POA: Diagnosis not present

## 2023-02-23 DIAGNOSIS — Z79899 Other long term (current) drug therapy: Secondary | ICD-10-CM | POA: Diagnosis not present

## 2023-02-23 DIAGNOSIS — E538 Deficiency of other specified B group vitamins: Secondary | ICD-10-CM | POA: Diagnosis not present

## 2023-02-23 DIAGNOSIS — R030 Elevated blood-pressure reading, without diagnosis of hypertension: Secondary | ICD-10-CM | POA: Diagnosis not present

## 2023-02-23 DIAGNOSIS — R42 Dizziness and giddiness: Secondary | ICD-10-CM | POA: Diagnosis not present

## 2023-02-26 DIAGNOSIS — E538 Deficiency of other specified B group vitamins: Secondary | ICD-10-CM | POA: Diagnosis not present

## 2023-03-05 DIAGNOSIS — E538 Deficiency of other specified B group vitamins: Secondary | ICD-10-CM | POA: Diagnosis not present

## 2023-03-12 DIAGNOSIS — E538 Deficiency of other specified B group vitamins: Secondary | ICD-10-CM | POA: Diagnosis not present

## 2023-03-19 DIAGNOSIS — E538 Deficiency of other specified B group vitamins: Secondary | ICD-10-CM | POA: Diagnosis not present

## 2023-04-22 DIAGNOSIS — E538 Deficiency of other specified B group vitamins: Secondary | ICD-10-CM | POA: Diagnosis not present

## 2023-04-22 DIAGNOSIS — G8929 Other chronic pain: Secondary | ICD-10-CM | POA: Diagnosis not present

## 2023-04-22 DIAGNOSIS — E782 Mixed hyperlipidemia: Secondary | ICD-10-CM | POA: Diagnosis not present

## 2023-04-22 DIAGNOSIS — M810 Age-related osteoporosis without current pathological fracture: Secondary | ICD-10-CM | POA: Diagnosis not present

## 2023-04-22 DIAGNOSIS — M5441 Lumbago with sciatica, right side: Secondary | ICD-10-CM | POA: Diagnosis not present

## 2023-04-22 DIAGNOSIS — Z79899 Other long term (current) drug therapy: Secondary | ICD-10-CM | POA: Diagnosis not present

## 2023-05-24 DIAGNOSIS — E538 Deficiency of other specified B group vitamins: Secondary | ICD-10-CM | POA: Diagnosis not present

## 2023-06-24 DIAGNOSIS — E538 Deficiency of other specified B group vitamins: Secondary | ICD-10-CM | POA: Diagnosis not present

## 2023-07-02 DIAGNOSIS — R6884 Jaw pain: Secondary | ICD-10-CM | POA: Diagnosis not present

## 2023-07-27 DIAGNOSIS — E538 Deficiency of other specified B group vitamins: Secondary | ICD-10-CM | POA: Diagnosis not present

## 2023-08-17 ENCOUNTER — Emergency Department: Payer: Medicare HMO

## 2023-08-17 ENCOUNTER — Ambulatory Visit
Admission: EM | Admit: 2023-08-17 | Discharge: 2023-08-17 | Disposition: A | Discharge status: Other Acute Inpatient Hospital | Payer: Medicare HMO | Source: Home / Self Care | Attending: Emergency Medicine | Admitting: Emergency Medicine

## 2023-08-17 ENCOUNTER — Emergency Department
Admission: EM | Admit: 2023-08-17 | Discharge: 2023-08-17 | Disposition: A | Discharge status: Home / Self Care | Payer: Medicare HMO | Attending: Emergency Medicine | Admitting: Emergency Medicine

## 2023-08-17 ENCOUNTER — Ambulatory Visit (INDEPENDENT_AMBULATORY_CARE_PROVIDER_SITE_OTHER): Payer: Medicare HMO

## 2023-08-17 DIAGNOSIS — R748 Abnormal levels of other serum enzymes: Secondary | ICD-10-CM | POA: Insufficient documentation

## 2023-08-17 DIAGNOSIS — D72819 Decreased white blood cell count, unspecified: Secondary | ICD-10-CM | POA: Diagnosis not present

## 2023-08-17 DIAGNOSIS — E785 Hyperlipidemia, unspecified: Secondary | ICD-10-CM | POA: Insufficient documentation

## 2023-08-17 DIAGNOSIS — R319 Hematuria, unspecified: Secondary | ICD-10-CM

## 2023-08-17 DIAGNOSIS — E876 Hypokalemia: Secondary | ICD-10-CM | POA: Insufficient documentation

## 2023-08-17 DIAGNOSIS — I10 Essential (primary) hypertension: Secondary | ICD-10-CM | POA: Insufficient documentation

## 2023-08-17 DIAGNOSIS — R531 Weakness: Secondary | ICD-10-CM | POA: Diagnosis not present

## 2023-08-17 DIAGNOSIS — R7401 Elevation of levels of liver transaminase levels: Secondary | ICD-10-CM | POA: Insufficient documentation

## 2023-08-17 DIAGNOSIS — R1013 Epigastric pain: Secondary | ICD-10-CM | POA: Insufficient documentation

## 2023-08-17 DIAGNOSIS — Z1152 Encounter for screening for COVID-19: Secondary | ICD-10-CM | POA: Insufficient documentation

## 2023-08-17 DIAGNOSIS — E860 Dehydration: Secondary | ICD-10-CM

## 2023-08-17 DIAGNOSIS — E559 Vitamin D deficiency, unspecified: Secondary | ICD-10-CM | POA: Insufficient documentation

## 2023-08-17 LAB — COMPREHENSIVE METABOLIC PANEL
ALT: 547 U/L — ABNORMAL HIGH (ref 0–44)
AST: 386 U/L — ABNORMAL HIGH (ref 15–41)
Albumin: 3.6 g/dL (ref 3.5–5.0)
Alkaline Phosphatase: 194 U/L — ABNORMAL HIGH (ref 38–126)
Anion gap: 9 (ref 5–15)
BUN: 13 mg/dL (ref 8–23)
CO2: 25 mmol/L (ref 22–32)
Calcium: 10 mg/dL (ref 8.9–10.3)
Chloride: 99 mmol/L (ref 98–111)
Creatinine, Ser: 0.53 mg/dL (ref 0.44–1.00)
GFR, Estimated: >60 mL/min (ref >60)
Glucose, Bld: 123 mg/dL — ABNORMAL HIGH (ref 70–99)
Potassium: 3.2 mmol/L — ABNORMAL LOW (ref 3.5–5.1)
Sodium: 133 mmol/L — ABNORMAL LOW (ref 135–145)
Total Bilirubin: 0.9 mg/dL (ref 0.3–1.2)
Total Protein: 6.8 g/dL (ref 6.5–8.1)

## 2023-08-17 LAB — CBC WITH DIFFERENTIAL/PLATELET
Abs Immature Granulocytes: 0 10*3/uL (ref 0.00–0.07)
Band Neutrophils: 20 %
Basophils Absolute: 0 10*3/uL (ref 0.0–0.1)
Basophils Relative: 0 %
Eosinophils Absolute: 0 10*3/uL (ref 0.0–0.5)
Eosinophils Relative: 0 %
HCT: 36 % (ref 36.0–46.0)
Hemoglobin: 12 g/dL (ref 12.0–15.0)
Lymphocytes Relative: 54 %
Lymphs Abs: 1.6 10*3/uL (ref 0.7–4.0)
MCH: 28.2 pg (ref 26.0–34.0)
MCHC: 33.3 g/dL (ref 30.0–36.0)
MCV: 84.7 fL (ref 80.0–100.0)
Metamyelocytes Relative: 1 %
Monocytes Absolute: 0.5 10*3/uL (ref 0.1–1.0)
Monocytes Relative: 18 %
Neutro Abs: 0.8 10*3/uL — ABNORMAL LOW (ref 1.7–7.7)
Neutrophils Relative %: 7 %
Platelets: 216 10*3/uL (ref 150–400)
RBC: 4.25 MIL/uL (ref 3.87–5.11)
RDW: 12.6 % (ref 11.5–15.5)
WBC: 3 10*3/uL — ABNORMAL LOW (ref 4.0–10.5)
nRBC: 0 % (ref 0.0–0.2)

## 2023-08-17 LAB — URINALYSIS, ROUTINE W REFLEX MICROSCOPIC
Glucose, UA: 100 mg/dL — AB
Ketones, ur: 15 mg/dL — AB
Leukocytes,Ua: NEGATIVE
Nitrite: NEGATIVE
Protein, ur: >300 mg/dL — AB
Specific Gravity, Urine: >1.03 — ABNORMAL HIGH (ref 1.005–1.030)
pH: 6 (ref 5.0–8.0)

## 2023-08-17 LAB — URINALYSIS, MICROSCOPIC (REFLEX): WBC, UA: NONE SEEN WBC/hpf (ref 0–5)

## 2023-08-17 LAB — TROPONIN I (HIGH SENSITIVITY): Troponin I (High Sensitivity): 7 ng/L (ref <18)

## 2023-08-17 LAB — LIPASE, BLOOD: Lipase: 21 U/L (ref 11–51)

## 2023-08-17 LAB — SARS CORONAVIRUS 2 BY RT PCR: SARS Coronavirus 2 by RT PCR: NEGATIVE

## 2023-08-17 MED ORDER — POTASSIUM CHLORIDE CRYS ER 20 MEQ PO TBCR
40.0000 meq | EXTENDED_RELEASE_TABLET | Freq: Once | ORAL | Status: AC
  Administered 2023-08-17: 40 meq via ORAL
  Filled 2023-08-17: qty 2

## 2023-08-17 MED ORDER — ONDANSETRON 4 MG PO TBDP
4.0000 mg | ORAL_TABLET | Freq: Once | ORAL | Status: AC
  Administered 2023-08-17: 4 mg via ORAL

## 2023-08-17 MED ORDER — SODIUM CHLORIDE 0.9 % IV SOLN: Freq: Once | INTRAVENOUS | Status: AC

## 2023-08-17 MED ORDER — ONDANSETRON HCL 4 MG PO TABS: 4.0000 mg | ORAL_TABLET | Freq: Four times a day (QID) | ORAL | 0 refills | Status: AC | PRN

## 2023-08-17 NOTE — ED Triage Notes (Signed)
Pt c/o upper abd pain,emesis & fatigue x4 days. Had 2 episodes of emesis today. Denies any nausea or diarrhea. States has foul taste in mouth. Has tried ibuprofen w/o relief. Recently returned from beach & had chinese food Saturday.

## 2023-08-17 NOTE — ED Notes (Signed)
Provided pt with discharge instructions and education. All of pt questions answered. Pt in possession of all belongings. Pt AAOX4 and stable at time of discharge.Pt ambulated w/ steady gait towards ED exit. Pt accompanied by family member.

## 2023-08-17 NOTE — Discharge Instructions (Addendum)
You were seen in the ER today for evaluation of your weakness.  Your ultrasound was reassuring.  We discussed further testing, but you did prefer to be discharged home and follow-up closely with your primary care doctor for further evaluation and likely repeat blood work.  Please return to the ER for any new or worsening symptoms.  Please make sure you are staying hydrated.  I sent a prescription for nausea medicine to your pharmacy that you can take as needed.

## 2023-08-17 NOTE — ED Triage Notes (Signed)
Pt referred to ED from UC for abd pain. Pt had abnormally high liver enzymes earlier today. Pt reports "taste of bowel movement" in her mouth. Last BM on Sunday.

## 2023-08-17 NOTE — Discharge Instructions (Addendum)
Go to the emergency department for further evaluation.  This could be hepatitis, or an obstruction somewhere in the biliary tree, causing liver damage.  She also has an abnormal urinalysis.  Her CBC is still pending.  Fortunately, your troponin and lipase were normal.  I did not appreciate any evidence of obstruction or perforation on your abdominal series, but the radiology read is pending.  I have given you 250 cc of normal saline and 4 mg of Zofran here.  Let them know if your  symptoms change, get worse, or for other concerns.

## 2023-08-17 NOTE — ED Provider Notes (Signed)
[facility] Provider Note    Event Date/Time   First MD Initiated Contact with Patient 08/17/23 2111     (approximate)   History   Abdominal Pain and Abnormal Lab   HPI  Felicia Mckenzie is a 77 year old female presenting to the emergency department for evaluation of weakness.  Companied by daughter who provides collateral history.  She reports that on Friday she went to a [place] buffet at the beach where she ate multiple seafood items.  A few hours later she began to develop some epigastric discomfort with associated nausea.  Other family members who ate there had similar symptoms but improved.  However, patient has continued to have episodes of vomiting, nausea, generalized weakness, decreased appetite.  She not had a bowel movement in several days, so she took a laxative on Saturday after which she had a large bowel movement.  No dysuria or urinary frequency.  No chest pain, shortness of breath.  Has felt some lightheadedness, no syncope.  Currently denies any abdominal pain.  She was initially seen in urgent care.  There, she was found to be borderline febrile with a temp of 100.3, normal heart rate and blood pressure.  Her workup demonstrated CBC with mild leukopenia, CMP notable for mild hypokalemia with a potassium of 3.2.  Liver enzymes were elevated with an AST of 386 and an ALT of 547, normal bilirubin.  Normal lipase.  Negative troponin.  Her urine showed signs of dehydration with ketones present.  Overall not consistent with infection with negative leukocyte esterase and nitrites, no white blood cells.  She was given ODT Zofran and 250 cc of IV fluids.  In the setting of her elevated liver enzymes, patient was directed to the ER for further evaluation.  Patient states that she now feels significantly improved after receiving a small fluid bolus.  Her nausea and abdominal discomfort have completely resolved.       Physical Exam   Triage Vital Signs: ED  Triage Vitals [08/17/23 1813]  Encounter Vitals Group     BP 131/81     Systolic BP Percentile      Diastolic BP Percentile      Pulse Rate 73     Resp 18     Temp 98.5 F (36.9 C)     Temp Source Oral     SpO2 100 %     Weight      Height      Head Circumference      Peak Flow      Pain Score      Pain Loc      Pain Education      Exclude from Growth Chart     Most recent vital signs: Vitals:   08/17/23 2100 08/17/23 2200  BP:  117/72  Pulse: 65 65  Resp: 12 15  Temp:    SpO2: 98% 100%     General: Awake, interactive  CV:  Regular rate, good peripheral perfusion.  Resp:  Lungs clear, unlabored respirations.  Abd:  Soft, nondistended, nontender to palpation diffusely without rebound or guarding, negative Murphy sign Neuro:  Symmetric facial movement, fluid speech   ED Results / Procedures / Treatments   Labs (all labs ordered are listed, but only abnormal results are displayed) Labs Reviewed  SARS CORONAVIRUS 2 BY RT PCR  HEPATITIS PANEL, ACUTE     EKG EKG independently reviewed interpreted by myself (ER attending) demonstrates:    RADIOLOGY Imaging independently reviewed  and interpreted by myself demonstrates:  RUQ [place] here with normal CBD diameter, no gallstones, no other acute findings noted  PROCEDURES:  Critical Care performed: No  Procedures   MEDICATIONS ORDERED IN ED: Medications  potassium chloride SA (KLOR-CON M) CR tablet 40 mEq (has no administration in time range)     IMPRESSION / MDM / ASSESSMENT AND PLAN / ED COURSE  I reviewed the triage vital signs and the nursing notes.  Differential diagnosis includes, but is not limited to, acute hepatitis, viral illness with hepatic inflammation, consideration for acute biliary process though normal bilirubin, lower suspicion pancreatitis with normal lipase, consideration for other acute intra-abdominal process though lower suspicion in the absence of abdominal pain  Patient's presentation  is most consistent with acute presentation with potential threat to life or bodily function.  77 year old female presenting with epigastric pain, vomiting, now resolved.  She was found to have elevated liver enzymes at urgent care.  Orally repleted for low potassium.  Ultrasound here reassuring.  No abdominal pain, but with her age did discuss CT for further evaluation.  I also did discuss further IV fluid resuscitation as patient only received a small fluid bolus.  However, patient reports she feels much improved and both her and daughter would strongly prefer to be discharged home without further imaging or fluid resuscitation.  She is tolerating p.o. intake.  They are agreeable to sending a hepatitis panel.  They will follow-up closely with her primary care doctor.  Will DC with a prescription for Zofran.  Strict return precautions provided including for concerns for developing abdominal pain, patient, or any other new or concerning symptoms.     FINAL CLINICAL IMPRESSION(S) / ED DIAGNOSES   Final diagnoses:  Weakness  Transaminitis     Rx / DC Orders   ED Discharge Orders          Ordered    ondansetron (ZOFRAN) 4 MG tablet  Every 6 hours PRN        08/17/23 2247             Note:  This document was prepared using Dragon voice recognition software and may include unintentional dictation errors.   Trinna Post, MD 08/17/23 [phone]

## 2023-08-17 NOTE — ED Notes (Signed)
ED Provider at bedside. 

## 2023-08-17 NOTE — ED Notes (Signed)
Patient is being discharged from the Urgent Care and sent to the Emergency Department via POV. Per Domenick Gong, MD, patient is in need of higher level of care due to abnormal liver enzymes. Patient is aware and verbalizes understanding of plan of care.  Vitals:   08/17/23 1416  BP: 123/76  Pulse: 74  Resp: 16  Temp: 100.3 F (37.9 C)  SpO2: 94%

## 2023-08-17 NOTE — ED Provider Notes (Addendum)
HPI  SUBJECTIVE:  Felicia Mckenzie is a 77 y.o. female who presents with chills and "feeling hot" starting an hour after eating a [place] buffet 4 days ago.  She reports intermittent, sharp, minutes long epigastric pain that radiates to her umbilicus for the past 3 days.  It does not radiate through to her back.  She reports lightheadedness, generalized weakness, nausea.  She had 2 episodes of nonbilious, nonbloody emesis today and reports a foul taste in her mouth.  She reports anorexia, and has not tried to have anything to eat or drink since throwing up this morning.  No chest pain, shortness of breath, pressure or heaviness, coughing, wheezing, diaphoresis.  No urinary complaints.  No fevers at home.  No nasal congestion, rhinorrhea, body aches or headaches.  She has been having issues with constipation.  Last bowel movement was 2 days ago.  This is within her normal stooling pattern.  She had a normal colored bowel movement.  No abdominal distention.  She has not tried anything for her symptoms.  Her lightheadedness is better with lying down, worse when she stands up.  She states that the car ride over here was painful.  She has a past medical history of hypercholesterolemia.  No history of hypertension, diabetes, coronary disease, MI, smoking, BMI above 30, CVA, PAD/PVD, pancreatitis, gallbladder disease, abdominal surgeries, mesenteric ischemia, atrial fibrillation, UTI, peptic ulcer disease, gastritis, alcohol or NSAID use.  Family history negative for early MI.  PCP: [facility].   Past Medical History:  Diagnosis Date   Hyperlipemia     History reviewed. No pertinent surgical history.  Family History  Problem Relation Age of Onset   Breast cancer Neg Hx     Social History   Tobacco Use   Smoking status: Never   Smokeless tobacco: Never  Vaping Use   Vaping status: Never Used  Substance Use Topics   Alcohol use: Never   Drug use: Never    No current facility-administered  medications for this encounter.  Current Outpatient Medications:    aspirin EC 81 MG tablet, Take by mouth., Disp: , Rfl:    Calcium Carb-Cholecalciferol 600-20 MG-MCG TABS, Take 2 tablets by mouth daily., Disp: , Rfl:    carbamazepine (TEGRETOL XR) 100 MG 12 hr tablet, SMARTSIG:2 Tablet(s) By Mouth Every 12 Hours, Disp: , Rfl:    cyclobenzaprine (FLEXERIL) 10 MG tablet, Take 10 mg by mouth at bedtime as needed., Disp: , Rfl:    diclofenac (VOLTAREN) 75 MG EC tablet, Take 1 tablet by mouth 2 (two) times daily with a meal., Disp: , Rfl:    LORazepam (ATIVAN) 1 MG tablet, Take by mouth., Disp: , Rfl:    oxyCODONE-acetaminophen (PERCOCET/ROXICET) 5-325 MG tablet, Take by mouth., Disp: , Rfl:    simvastatin (ZOCOR) 40 MG tablet, Take 1 tablet by mouth daily., Disp: , Rfl:    benzonatate (TESSALON) 200 MG capsule, Take 1 capsule (200 mg total) by mouth 3 (three) times daily as needed for cough., Disp: 20 capsule, Rfl: 0   ondansetron (ZOFRAN-ODT) 4 MG disintegrating tablet, Take 1 tablet (4 mg total) by mouth every 6 (six) hours as needed for nausea or vomiting., Disp: 15 tablet, Rfl: 0  Allergies  Allergen Reactions   Diphenhydramine Hcl Other (See Comments)    Can take pills   Hydrocodone-Acetaminophen Other (See Comments)   Tramadol Other (See Comments)     ROS  As noted in HPI.   Physical Exam  BP 123/76 (BP  Location: Left Arm)   Pulse 74   Temp 100.3 F (37.9 C) (Oral)   Resp 16   Ht 4\' 3"  (1.295 m)   Wt 54.4 kg   SpO2 94%   BMI 32.44 kg/m   Orthostatic VS for the past 24 hrs:  BP- Lying Pulse- Lying BP- Sitting Pulse- Sitting BP- Standing at 0 minutes Pulse- Standing at 0 minutes  08/17/23 1508 105/66 74 116/68 73 124/76 76      Constitutional: Well developed, well nourished, no acute distress.  Appears ill. Eyes:  EOMI, conjunctiva normal bilaterally HENT: Normocephalic, atraumatic,mucus membranes moist Respiratory: Normal inspiratory effort.  Lungs clear  bilaterally. Cardiovascular: Normal rate, regular rhythm, no murmurs rubs or gallops GI: nondistended, soft, positive epigastric tenderness, no guarding, rebound.  Active bowel sounds.  Negative Murphy, negative McBurney.  No periumbilical tenderness. Back: No CVAT. skin: No rash, skin intact Musculoskeletal: no deformities Neurologic: Alert & oriented x 3, no focal neuro deficits Psychiatric: Speech and behavior appropriate   ED Course   Medications  ondansetron (ZOFRAN-ODT) disintegrating tablet 4 mg (4 mg Oral Given 08/17/23 1524)  0.9 %  sodium chloride infusion (0 mLs Intravenous Stopped 08/17/23 1634)    Orders Placed This Encounter  Procedures   DG Abd Acute W/Chest    Standing Status:   Standing    Number of Occurrences:   1    Order Specific Question:   Reason for Exam (SYMPTOM  OR DIAGNOSIS REQUIRED)    Answer:   Epigastric abdominal pain, no bowel movement 2 days, low-grade fever 100.3, vomiting.  Rule out perforation, obstruction.   CBC with Differential    Standing Status:   Standing    Number of Occurrences:   1   Comprehensive metabolic panel    Standing Status:   Standing    Number of Occurrences:   1   Lipase, blood    Standing Status:   Standing    Number of Occurrences:   1   Urinalysis, Routine w reflex microscopic -Urine, Clean Catch    Standing Status:   Standing    Number of Occurrences:   1    Order Specific Question:   Specimen Source    Answer:   Urine, Clean Catch [76]   Urinalysis, Microscopic (reflex)    Standing Status:   Standing    Number of Occurrences:   1   Orthostatic vital signs    Standing Status:   Standing    Number of Occurrences:   1   ED EKG    Standing Status:   Standing    Number of Occurrences:   1    Order Specific Question:   Reason for Exam    Answer:   Weakness   Insert peripheral IV    Standing Status:   Standing    Number of Occurrences:   1    Results for orders placed or performed during the hospital encounter  of 08/17/23 (from the past 24 hour(s))  Urinalysis, Routine w reflex microscopic -Urine, Clean Catch     Status: Abnormal   Collection Time: 08/17/23  3:19 PM  Result Value Ref Range   Color, Urine AMBER (A) YELLOW   APPearance CLEAR CLEAR   Specific Gravity, Urine >1.030 (H) 1.005 - 1.030   pH 6.0 5.0 - 8.0   Glucose, UA 100 (A) NEGATIVE mg/dL   Hgb urine dipstick MODERATE (A) NEGATIVE   Bilirubin Urine LARGE (A) NEGATIVE   Ketones, ur 15 (A) NEGATIVE  mg/dL   Protein, ur >161 (A) NEGATIVE mg/dL   Nitrite NEGATIVE NEGATIVE   Leukocytes,Ua NEGATIVE NEGATIVE  Urinalysis, Microscopic (reflex)     Status: Abnormal   Collection Time: 08/17/23  3:19 PM  Result Value Ref Range   RBC / HPF 6-10 0 - 5 RBC/hpf   WBC, UA NONE SEEN 0 - 5 WBC/hpf   Bacteria, UA FEW (A) NONE SEEN   Squamous Epithelial / HPF 0-5 0 - 5 /HPF   Amorphous Crystal PRESENT   CBC with Differential     Status: Abnormal   Collection Time: 08/17/23  3:38 PM  Result Value Ref Range   WBC 3.0 (L) 4.0 - 10.5 K/uL   RBC 4.25 3.87 - 5.11 MIL/uL   Hemoglobin 12.0 12.0 - 15.0 g/dL   HCT 09.6 04.5 - 40.9 %   MCV 84.7 80.0 - 100.0 fL   MCH 28.2 26.0 - 34.0 pg   MCHC 33.3 30.0 - 36.0 g/dL   RDW 81.1 91.4 - 78.2 %   Platelets 216 150 - 400 K/uL   nRBC 0.0 0.0 - 0.2 %   Neutrophils Relative % 7 %   Neutro Abs 0.8 (L) 1.7 - 7.7 K/uL   Band Neutrophils 20 %   Lymphocytes Relative 54 %   Lymphs Abs 1.6 0.7 - 4.0 K/uL   Monocytes Relative 18 %   Monocytes Absolute 0.5 0.1 - 1.0 K/uL   Eosinophils Relative 0 %   Eosinophils Absolute 0.0 0.0 - 0.5 K/uL   Basophils Relative 0 %   Basophils Absolute 0.0 0.0 - 0.1 K/uL   Metamyelocytes Relative 1 %   Abs Immature Granulocytes 0.00 0.00 - 0.07 K/uL  Comprehensive metabolic panel     Status: Abnormal   Collection Time: 08/17/23  3:38 PM  Result Value Ref Range   Sodium 133 (L) 135 - 145 mmol/L   Potassium 3.2 (L) 3.5 - 5.1 mmol/L   Chloride 99 98 - 111 mmol/L   CO2 25 22 - 32  mmol/L   Glucose, Bld 123 (H) 70 - 99 mg/dL   BUN 13 8 - 23 mg/dL   Creatinine, Ser 9.56 0.44 - 1.00 mg/dL   Calcium 21.3 8.9 - 08.6 mg/dL   Total Protein 6.8 6.5 - 8.1 g/dL   Albumin 3.6 3.5 - 5.0 g/dL   AST 578 (H) 15 - 41 U/L   ALT 547 (H) 0 - 44 U/L   Alkaline Phosphatase 194 (H) 38 - 126 U/L   Total Bilirubin 0.9 0.3 - 1.2 mg/dL   GFR, Estimated >46 >96 mL/min   Anion gap 9 5 - 15  Lipase, blood     Status: None   Collection Time: 08/17/23  3:38 PM  Result Value Ref Range   Lipase 21 11 - 51 U/L  Troponin I (High Sensitivity)     Status: None   Collection Time: 08/17/23  3:38 PM  Result Value Ref Range   Troponin I (High Sensitivity) 7 <18 ng/L   DG Abd Acute W/Chest  Result Date: 08/17/2023 CLINICAL DATA:  Epigastric pain EXAM: DG ABDOMEN ACUTE WITH 1 VIEW CHEST COMPARISON:  07/18/2013 FINDINGS: Single-view chest demonstrates no focal airspace disease or effusion. Normal cardiac size with aortic atherosclerosis. Supine and upright views of the abdomen demonstrate no free air beneath the diaphragm. Nonobstructed gas pattern with moderate stool in the colon. IMPRESSION: 1. No active cardiopulmonary disease. 2. Nonobstructed gas pattern with moderate stool in the colon. Electronically Signed  By: Jasmine Pang M.D.   On: 08/17/2023 17:16    ED Clinical Impression  1. Epigastric pain   2. Dehydration   3. Abnormal liver enzymes   4. Hematuria, unspecified type      ED Assessment/Plan     Patient presents with epigastric pain, generalized weakness/lightheadedness, nasuea, vomiting and a foul taste in her mouth.  She is febrile here at 100.3.  I had a long discussion with the daughter and patient, that I could do a workup here, with the understanding that if I find anything abnormal, I would be sending her to the emergency department, which could delay care.  I gave them the option of going to the emergency department, but daughter states that the patient will absolutely  refuse to go to the hospital unless necessary.  The daughter and patient want to initiate a workup here.  In the differential is gastritis, pancreatitis, perforated viscus, perforated ulcer, peptic ulcer disease, ACS, dehydration.  Lower in the differential is hepatitis, cholecystitis, mesenteric ischemia, hepatitis, UTI/pyelonephritis.  Doubt appendicitis.  EKG: Normal sinus rhythm, rate 71.  Normal axis, normal intervals.  No hypertrophy.  Nonspecific ST changes in lead III.  No ST elevation.  No previous EKG for comparison.  Patient is not orthostatic.  Checking CBC, CMP, lipase, UA, troponin, and acute abdominal series.  Giving Zofran 4 mg ODT here.  Will give 250 cc of IV fluids and reevaluate.  Patient is dehydrated.  She has glucosuria, moderate hematuria, large bilirubin, ketones and protein.  Negative nitrites, esterase, but a few bacteria..  Reviewed imaging independently.  No free air, air-fluid levels noted on acute abdominal series as read by me.  Formal radiology report pending at time of discharge.  Labs reviewed.  Troponin, lipase normal.  She has an elevated AST, ALT and alkaline phosphate.  She could have a biliary obstruction causing liver damage, or hepatitis.  I did not appreciate any evidence of obstruction or perforation on acute abdominal series.  On reevaluation, patient states that she feels better after the 250 cc bolus of fluids.  She states her lightheadedness has improved.  However, I believe that the patient would benefit from advanced imaging to rule out biliary obstruction, further testing to evaluate for hepatitis or for any other cause of her abnormal LFTs and abdominal pain.  She is stable to go by private vehicle.  Discussed rationale for transfer to the emergency department with patient and daughter.  They agree to go. Complete CBC pending.  Per lab white count is 3 with with predominance of lymphocytes, suggesting a viral infection.  Spent 40 minutes with patient  and daughter obtaining history and physical, reviewing labs, imaging, EKG, and doing multiple reevaluations.  Meds ordered this encounter  Medications   ondansetron (ZOFRAN-ODT) disintegrating tablet 4 mg   0.9 %  sodium chloride infusion      *This clinic note was created using [employer], clinical (histocompatibility and immunogenetics). Therefore, there may be occasional mistakes despite careful proofreading.  ?    Domenick Gong, MD 08/17/23 1703    Domenick Gong, MD 08/17/23 [phone]

## 2023-08-18 LAB — HEPATITIS PANEL, ACUTE
HCV Ab: NONREACTIVE
Hep A IgM: NONREACTIVE
Hep B C IgM: NONREACTIVE
Hepatitis B Surface Ag: NONREACTIVE

## 2023-08-26 DIAGNOSIS — R7401 Elevation of levels of liver transaminase levels: Secondary | ICD-10-CM | POA: Diagnosis not present

## 2023-08-26 DIAGNOSIS — Z8639 Personal history of other endocrine, nutritional and metabolic disease: Secondary | ICD-10-CM | POA: Diagnosis not present

## 2023-08-26 DIAGNOSIS — D72829 Elevated white blood cell count, unspecified: Secondary | ICD-10-CM | POA: Diagnosis not present

## 2023-08-26 DIAGNOSIS — E538 Deficiency of other specified B group vitamins: Secondary | ICD-10-CM | POA: Diagnosis not present

## 2023-08-26 DIAGNOSIS — K5909 Other constipation: Secondary | ICD-10-CM | POA: Diagnosis not present

## 2023-09-27 DIAGNOSIS — E538 Deficiency of other specified B group vitamins: Secondary | ICD-10-CM | POA: Diagnosis not present

## 2023-10-27 DIAGNOSIS — E538 Deficiency of other specified B group vitamins: Secondary | ICD-10-CM | POA: Diagnosis not present

## 2023-11-22 ENCOUNTER — Other Ambulatory Visit: Payer: Self-pay | Admitting: Nurse Practitioner

## 2023-11-22 DIAGNOSIS — M5441 Lumbago with sciatica, right side: Secondary | ICD-10-CM | POA: Diagnosis not present

## 2023-11-22 DIAGNOSIS — Z79899 Other long term (current) drug therapy: Secondary | ICD-10-CM | POA: Diagnosis not present

## 2023-11-22 DIAGNOSIS — Z0000 Encounter for general adult medical examination without abnormal findings: Secondary | ICD-10-CM | POA: Diagnosis not present

## 2023-11-22 DIAGNOSIS — G8929 Other chronic pain: Secondary | ICD-10-CM | POA: Diagnosis not present

## 2023-11-22 DIAGNOSIS — Z1231 Encounter for screening mammogram for malignant neoplasm of breast: Secondary | ICD-10-CM | POA: Diagnosis not present

## 2023-11-22 DIAGNOSIS — M810 Age-related osteoporosis without current pathological fracture: Secondary | ICD-10-CM | POA: Diagnosis not present

## 2023-11-22 DIAGNOSIS — E538 Deficiency of other specified B group vitamins: Secondary | ICD-10-CM | POA: Diagnosis not present

## 2023-11-22 DIAGNOSIS — E782 Mixed hyperlipidemia: Secondary | ICD-10-CM | POA: Diagnosis not present

## 2023-12-08 ENCOUNTER — Ambulatory Visit
Admission: RE | Admit: 2023-12-08 | Discharge: 2023-12-08 | Disposition: A | Discharge status: Home / Self Care | Payer: Medicare HMO | Source: Ambulatory Visit | Attending: Nurse Practitioner | Admitting: Nurse Practitioner

## 2023-12-08 DIAGNOSIS — Z1231 Encounter for screening mammogram for malignant neoplasm of breast: Secondary | ICD-10-CM | POA: Insufficient documentation

## 2023-12-23 DIAGNOSIS — E538 Deficiency of other specified B group vitamins: Secondary | ICD-10-CM | POA: Diagnosis not present

## 2024-01-24 DIAGNOSIS — E538 Deficiency of other specified B group vitamins: Secondary | ICD-10-CM | POA: Diagnosis not present

## 2024-02-24 DIAGNOSIS — L255 Unspecified contact dermatitis due to plants, except food: Secondary | ICD-10-CM | POA: Diagnosis not present

## 2024-02-24 DIAGNOSIS — E538 Deficiency of other specified B group vitamins: Secondary | ICD-10-CM | POA: Diagnosis not present

## 2024-04-11 DIAGNOSIS — E785 Hyperlipidemia, unspecified: Secondary | ICD-10-CM | POA: Diagnosis not present

## 2024-04-11 DIAGNOSIS — L723 Sebaceous cyst: Secondary | ICD-10-CM | POA: Diagnosis not present

## 2024-04-11 DIAGNOSIS — G8929 Other chronic pain: Secondary | ICD-10-CM | POA: Diagnosis not present

## 2024-04-11 DIAGNOSIS — Z1331 Encounter for screening for depression: Secondary | ICD-10-CM | POA: Diagnosis not present

## 2024-04-11 DIAGNOSIS — Z79899 Other long term (current) drug therapy: Secondary | ICD-10-CM | POA: Diagnosis not present

## 2024-04-11 DIAGNOSIS — M5441 Lumbago with sciatica, right side: Secondary | ICD-10-CM | POA: Diagnosis not present

## 2024-04-11 DIAGNOSIS — E538 Deficiency of other specified B group vitamins: Secondary | ICD-10-CM | POA: Diagnosis not present

## 2024-04-11 DIAGNOSIS — M810 Age-related osteoporosis without current pathological fracture: Secondary | ICD-10-CM | POA: Diagnosis not present

## 2024-07-28 DIAGNOSIS — M549 Dorsalgia, unspecified: Secondary | ICD-10-CM | POA: Diagnosis not present

## 2024-07-28 DIAGNOSIS — Z79899 Other long term (current) drug therapy: Secondary | ICD-10-CM | POA: Diagnosis not present

## 2024-07-28 DIAGNOSIS — G8929 Other chronic pain: Secondary | ICD-10-CM | POA: Diagnosis not present

## 2024-07-28 DIAGNOSIS — K068 Other specified disorders of gingiva and edentulous alveolar ridge: Secondary | ICD-10-CM | POA: Diagnosis not present

## 2024-10-13 ENCOUNTER — Other Ambulatory Visit: Payer: Self-pay | Admitting: Nurse Practitioner

## 2024-10-13 DIAGNOSIS — Z1231 Encounter for screening mammogram for malignant neoplasm of breast: Secondary | ICD-10-CM

## 2024-10-13 DIAGNOSIS — E785 Hyperlipidemia, unspecified: Secondary | ICD-10-CM | POA: Diagnosis not present

## 2024-10-13 DIAGNOSIS — M810 Age-related osteoporosis without current pathological fracture: Secondary | ICD-10-CM | POA: Diagnosis not present

## 2024-10-13 DIAGNOSIS — R3989 Other symptoms and signs involving the genitourinary system: Secondary | ICD-10-CM | POA: Diagnosis not present

## 2024-10-13 DIAGNOSIS — Z1331 Encounter for screening for depression: Secondary | ICD-10-CM | POA: Diagnosis not present

## 2024-10-13 DIAGNOSIS — Z79899 Other long term (current) drug therapy: Secondary | ICD-10-CM | POA: Diagnosis not present

## 2024-10-13 DIAGNOSIS — R399 Unspecified symptoms and signs involving the genitourinary system: Secondary | ICD-10-CM | POA: Diagnosis not present

## 2024-10-13 DIAGNOSIS — M5441 Lumbago with sciatica, right side: Secondary | ICD-10-CM | POA: Diagnosis not present

## 2024-10-13 DIAGNOSIS — Z0000 Encounter for general adult medical examination without abnormal findings: Secondary | ICD-10-CM | POA: Diagnosis not present

## 2024-10-13 DIAGNOSIS — E538 Deficiency of other specified B group vitamins: Secondary | ICD-10-CM | POA: Diagnosis not present

## 2024-10-18 ENCOUNTER — Other Ambulatory Visit: Payer: Self-pay | Admitting: Nurse Practitioner

## 2024-10-18 DIAGNOSIS — M51369 Other intervertebral disc degeneration, lumbar region without mention of lumbar back pain or lower extremity pain: Secondary | ICD-10-CM

## 2024-10-18 DIAGNOSIS — M4800 Spinal stenosis, site unspecified: Secondary | ICD-10-CM

## 2024-10-18 DIAGNOSIS — G8929 Other chronic pain: Secondary | ICD-10-CM

## 2024-10-20 ENCOUNTER — Ambulatory Visit
Admission: RE | Admit: 2024-10-20 | Discharge: 2024-10-20 | Disposition: A | Discharge status: Home / Self Care | Source: Ambulatory Visit | Attending: Nurse Practitioner | Admitting: Nurse Practitioner

## 2024-10-20 DIAGNOSIS — M4800 Spinal stenosis, site unspecified: Secondary | ICD-10-CM | POA: Diagnosis not present

## 2024-10-20 DIAGNOSIS — M51369 Other intervertebral disc degeneration, lumbar region without mention of lumbar back pain or lower extremity pain: Secondary | ICD-10-CM | POA: Diagnosis not present

## 2024-10-20 DIAGNOSIS — M47816 Spondylosis without myelopathy or radiculopathy, lumbar region: Secondary | ICD-10-CM | POA: Diagnosis not present

## 2024-10-20 DIAGNOSIS — M5441 Lumbago with sciatica, right side: Secondary | ICD-10-CM | POA: Diagnosis not present

## 2024-10-20 DIAGNOSIS — M4316 Spondylolisthesis, lumbar region: Secondary | ICD-10-CM | POA: Diagnosis not present

## 2024-10-20 DIAGNOSIS — G8929 Other chronic pain: Secondary | ICD-10-CM | POA: Diagnosis not present

## 2024-10-20 DIAGNOSIS — M5136 Other intervertebral disc degeneration, lumbar region with discogenic back pain only: Secondary | ICD-10-CM | POA: Diagnosis not present

## 2024-10-20 DIAGNOSIS — M48061 Spinal stenosis, lumbar region without neurogenic claudication: Secondary | ICD-10-CM | POA: Diagnosis not present

## 2024-12-12 ENCOUNTER — Ambulatory Visit
Admission: RE | Admit: 2024-12-12 | Discharge: 2024-12-12 | Disposition: A | Discharge status: Home / Self Care | Source: Ambulatory Visit | Attending: Nurse Practitioner | Admitting: Nurse Practitioner

## 2024-12-12 DIAGNOSIS — Z1231 Encounter for screening mammogram for malignant neoplasm of breast: Secondary | ICD-10-CM | POA: Diagnosis present
